# Patient Record
Sex: Male | Born: 1995 | Race: White | Hispanic: No | Smoking: Former smoker
Health system: Southern US, Community
[De-identification: ages and names within clinical notes are randomized; demographics above are authoritative.]

## PROBLEM LIST (undated history)

## (undated) DIAGNOSIS — Z87898 Personal history of other specified conditions: Secondary | ICD-10-CM

## (undated) HISTORY — DX: Personal history of other specified conditions: Z87.898

## (undated) HISTORY — PX: SINUS IRRIGATION: SHX2411

---
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
## 2014-09-09 ENCOUNTER — Encounter (HOSPITAL_COMMUNITY): Payer: Self-pay | Admitting: Emergency Medicine

## 2014-09-09 ENCOUNTER — Emergency Department (INDEPENDENT_AMBULATORY_CARE_PROVIDER_SITE_OTHER)
Admission: EM | Admit: 2014-09-09 | Discharge: 2014-09-09 | Disposition: A | Discharge status: Home / Self Care | Payer: BC Managed Care – PPO | Source: Home / Self Care | Attending: Emergency Medicine | Admitting: Emergency Medicine

## 2014-09-09 DIAGNOSIS — G43009 Migraine without aura, not intractable, without status migrainosus: Secondary | ICD-10-CM

## 2014-09-09 MED ORDER — SUMATRIPTAN SUCCINATE 50 MG PO TABS: 50.0000 mg | ORAL_TABLET | Freq: Once | ORAL | Status: DC | PRN

## 2014-09-09 MED ORDER — KETOROLAC TROMETHAMINE 60 MG/2ML IM SOLN
INTRAMUSCULAR | Status: AC
  Filled 2014-09-09: qty 2

## 2014-09-09 MED ORDER — METOCLOPRAMIDE HCL 5 MG/ML IJ SOLN
10.0000 mg | Freq: Once | INTRAMUSCULAR | Status: AC
  Administered 2014-09-09: 10 mg via INTRAMUSCULAR

## 2014-09-09 MED ORDER — DIPHENHYDRAMINE HCL 50 MG/ML IJ SOLN
25.0000 mg | Freq: Once | INTRAMUSCULAR | Status: AC
  Administered 2014-09-09: 25 mg via INTRAMUSCULAR

## 2014-09-09 MED ORDER — METOCLOPRAMIDE HCL 5 MG/ML IJ SOLN
INTRAMUSCULAR | Status: AC
  Filled 2014-09-09: qty 2

## 2014-09-09 MED ORDER — DIPHENHYDRAMINE HCL 50 MG/ML IJ SOLN
INTRAMUSCULAR | Status: AC
  Filled 2014-09-09: qty 1

## 2014-09-09 MED ORDER — KETOROLAC TROMETHAMINE 60 MG/2ML IM SOLN
60.0000 mg | Freq: Once | INTRAMUSCULAR | Status: AC
  Administered 2014-09-09: 60 mg via INTRAMUSCULAR

## 2014-09-09 NOTE — ED Notes (Signed)
Pt states that he has had headache feeling like a migraine for weeks with sinus pain. Pt  States that he has nausea and vomiting. Pt is currently in room vomiting. Pt is in no acute distress.

## 2014-09-09 NOTE — Discharge Instructions (Signed)
Please follow up with a regular doctor as soon as possible. Use the imitrex as soon as you feel a headache starting, don't wait until it's really bad or the medicine won't work.    Migraine Headache A migraine headache is an intense, throbbing pain on one or both sides of your head. A migraine can last for 30 minutes to several hours. CAUSES  The exact cause of a migraine headache is not always known. However, a migraine may be caused when nerves in the brain become irritated and release chemicals that cause inflammation. This causes pain. Certain things may also trigger migraines, such as:  Alcohol.  Smoking.  Stress.  Menstruation.  Aged cheeses.  Foods or drinks that contain nitrates, glutamate, aspartame, or tyramine.  Lack of sleep.  Chocolate.  Caffeine.  Hunger.  Physical exertion.  Fatigue.  Medicines used to treat chest pain (nitroglycerine), birth control pills, estrogen, and some blood pressure medicines. SIGNS AND SYMPTOMS  Pain on one or both sides of your head.  Pulsating or throbbing pain.  Severe pain that prevents daily activities.  Pain that is aggravated by any physical activity.  Nausea, vomiting, or both.  Dizziness.  Pain with exposure to bright lights, loud noises, or activity.  General sensitivity to bright lights, loud noises, or smells. Before you get a migraine, you may get warning signs that a migraine is coming (aura). An aura may include:  Seeing flashing lights.  Seeing bright spots, halos, or zigzag lines.  Having tunnel vision or blurred vision.  Having feelings of numbness or tingling.  Having trouble talking.  Having muscle weakness. DIAGNOSIS  A migraine headache is often diagnosed based on:  Symptoms.  Physical exam.  A CT scan or MRI of your head. These imaging tests cannot diagnose migraines, but they can help rule out other causes of headaches. TREATMENT Medicines may be given for pain and nausea.  Medicines can also be given to help prevent recurrent migraines.  HOME CARE INSTRUCTIONS  Only take over-the-counter or prescription medicines for pain or discomfort as directed by your health care provider. The use of long-term narcotics is not recommended.  Lie down in a dark, quiet room when you have a migraine.  Keep a journal to find out what may trigger your migraine headaches. For example, write down:  What you eat and drink.  How much sleep you get.  Any change to your diet or medicines.  Limit alcohol consumption.  Quit smoking if you smoke.  Get 7-9 hours of sleep, or as recommended by your health care provider.  Limit stress.  Keep lights dim if bright lights bother you and make your migraines worse. SEEK IMMEDIATE MEDICAL CARE IF:   Your migraine becomes severe.  You have a fever.  You have a stiff neck.  You have vision loss.  You have muscular weakness or loss of muscle control.  You start losing your balance or have trouble walking.  You feel faint or pass out.  You have severe symptoms that are different from your first symptoms. MAKE SURE YOU:   Understand these instructions.  Will watch your condition.  Will get help right away if you are not doing well or get worse. Document Released: 10/21/2005 Document Revised: 03/07/2014 Document Reviewed: 06/28/2013 [facility] Patient Information 2015 [place], [place]. This information is not intended to replace advice given to you by your health care provider. Make sure you discuss any questions you have with your health care provider.

## 2014-09-09 NOTE — ED Provider Notes (Signed)
CSN: [id number]     Arrival date & time 09/09/14  1843 History   First MD Initiated Contact with Patient 09/09/14 1936     Chief Complaint  Patient presents with  . Migraine  . Nausea  . Emesis   (Consider location/radiation/quality/duration/timing/severity/associated sxs/prior Treatment) HPI Comments: Pt reports frequent headaches for last month and weekly episodes of severe headache lasting all day associated with vomiting. Pt can go to sleep with severe headaches and wake up feeling better. Has not tried to treat symptoms. Describes headache location as behind both eyes, back of head and into neck.   Patient is a 18 y.o. male presenting with migraines and vomiting. The history is provided by the patient. No language interpreter was used.  Migraine This is a recurrent problem. The current episode started 6 to 12 hours ago. The problem occurs constantly. The problem has been gradually worsening. Associated symptoms include headaches. Pertinent negatives include no abdominal pain. Exacerbated by: lights. Nothing (nothing tried) relieves the symptoms. He has tried nothing for the symptoms.  Emesis Associated symptoms: headaches   Associated symptoms: no abdominal pain, no chills and no diarrhea     History reviewed. No pertinent past medical history. Past Surgical History  Procedure Laterality Date  . Sinus irrigation     History reviewed. No pertinent family history. History  Substance Use Topics  . Smoking status: Never Smoker   . Smokeless tobacco: Not on file  . Alcohol Use: No    Review of Systems  Constitutional: Negative for fever and chills.  Eyes: Positive for photophobia. Negative for visual disturbance.  Gastrointestinal: Positive for nausea and vomiting. Negative for abdominal pain, diarrhea and constipation.  Neurological: Positive for headaches.    Allergies  Review of patient's allergies indicates not on file.  Home Medications   Prior to Admission  medications   Medication Sig Start Date End Date Taking? Authorizing Provider  SUMAtriptan (IMITREX) 50 MG tablet Take 1 tablet (50 mg total) by mouth once as needed for migraine. May repeat in 2 hours if headache persists or recurs. 09/09/14 09/09/15  Cathlyn ParsonsAngela M Taviana Westergren, NP   BP 143/79 mmHg  Pulse 79  Temp(Src) 97.4 F (36.3 C) (Oral)  Resp 14  SpO2 100% Physical Exam  Constitutional: He is oriented to person, place, and time. He appears well-developed and well-nourished. He appears ill.  Eyes: Conjunctivae, EOM and lids are normal. Pupils are equal, round, and reactive to light.  Cardiovascular: Normal rate and regular rhythm.   Pulmonary/Chest: Effort normal and breath sounds normal.  Abdominal: Soft. Normal appearance and bowel sounds are normal. He exhibits no distension. There is no tenderness. There is no rigidity, no rebound and no guarding.  Neurological: He is alert and oriented to person, place, and time. Gait normal.    ED Course  Procedures (including critical care time) Labs Review Labs Reviewed - No data to display  Imaging Review No results found.   MDM   1. Migraine without aura and without status migrainosus, not intractable   Most likely migraine headaches. Pt given IM benadryl 25mg , IM toradol 60mg , and IM reglan 10mg  [facility] Rx imitrex 50mg  po prn migraine; may repeat in 2 hours if headache persists or recurs.   Pt to f/u with a pcp for further management of headaches.     Cathlyn ParsonsAngela M Railey Glad, NP 09/09/14 2109  Cathlyn ParsonsAngela M Chief Walkup, NP 09/09/14 2110

## 2014-10-05 ENCOUNTER — Ambulatory Visit: Payer: Self-pay | Admitting: Unknown Physician Specialty

## 2014-11-15 ENCOUNTER — Encounter (INDEPENDENT_AMBULATORY_CARE_PROVIDER_SITE_OTHER): Payer: Self-pay

## 2014-11-15 ENCOUNTER — Encounter: Payer: Self-pay | Admitting: Family Medicine

## 2014-11-15 ENCOUNTER — Ambulatory Visit (INDEPENDENT_AMBULATORY_CARE_PROVIDER_SITE_OTHER): Payer: 59 | Admitting: Family Medicine

## 2014-11-15 VITALS — BP 112/70 | HR 80 | Temp 98.4°F | Ht 67.5 in | Wt 190.8 lb

## 2014-11-15 DIAGNOSIS — Z0000 Encounter for general adult medical examination without abnormal findings: Secondary | ICD-10-CM

## 2014-11-15 DIAGNOSIS — G43009 Migraine without aura, not intractable, without status migrainosus: Secondary | ICD-10-CM

## 2014-11-15 DIAGNOSIS — J329 Chronic sinusitis, unspecified: Secondary | ICD-10-CM | POA: Insufficient documentation

## 2014-11-15 DIAGNOSIS — J069 Acute upper respiratory infection, unspecified: Secondary | ICD-10-CM

## 2014-11-15 DIAGNOSIS — G43909 Migraine, unspecified, not intractable, without status migrainosus: Secondary | ICD-10-CM | POA: Insufficient documentation

## 2014-11-15 MED ORDER — SUMATRIPTAN SUCCINATE 50 MG PO TABS: 50.0000 mg | ORAL_TABLET | Freq: Once | ORAL | Status: DC | PRN

## 2014-11-15 NOTE — Progress Notes (Signed)
Pre visit review using our clinic review tool, if applicable. No additional management support is needed unless otherwise documented below in the visit note. 

## 2014-11-15 NOTE — Patient Instructions (Signed)
I sent imitrex to your pharmacy  See the neurologist as planned  Labs today for wellness  Schedule a physical in the spring   For cold symptoms - try mucinex dm (helps cough and congestion in head and chest)  Lots of fluids and rest   Update if not starting to improve in a week or if worsening

## 2014-11-15 NOTE — Progress Notes (Signed)
Subjective:    Patient ID: Max Duffy, male    DOB: May 24, 1996, 19 y.o.   MRN: [id number]  HPI Here to get established for primary care   Does not have regular ENT - Dr Jenne CampusMcQueen  [facility]   [facility] has [facility]t end [facility] the mo  Had MRI - chronic sinusitis  Has had sinus surgery in the past (almost lost his eye to a severe sinus infection at age 89 )   Hard to tell the difference between sinus problems and migraine  Thinks he has a family hx of migraines  Had vomiting with headaches  For a while - almost every day  Now uses imitrex - about once per week and it works well   Is on clindamycin for chronic sinus infection  This is helping some  Will see Dr Jenne CampusMcQueen for f/u  Does not think he has allergies / but he may   Thinks he is coming down with a cold Had a fever last week-- aches and chills/ (? 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 From stress   There are no active problems to display for this patient.  Past Medical History  Diagnosis Date  . History of headache    Past Surgical History  Procedure Laterality Date  . Sinus irrigation      age 19  . Knee surgery  2010   History  Substance Use Topics  . Smoking status: Never Smoker   . Smokeless tobacco: Not on file  . Alcohol Use: No   Family History  Problem Relation Age of Onset  . Hypertension Maternal Grandfather   . Diabetes Paternal Grandfather   . Hypertension Maternal Uncle   . Diabetes Father    No Known Allergies Current Outpatient Prescriptions on File Prior to Visit  Medication Sig Dispense Refill  . SUMAtriptan (IMITREX) 50 MG tablet Take 1 tablet (50  mg total) by mouth once as needed for migraine. May repeat in 2 hours if headache persists or recurs. 30 tablet 2   No current facility-administered medications on file prior to visit.       Review of Systems Review of Systems  Constitutional: Negative for fever, appetite change,  and unexpected weight change.  Pos for congestion and rhinorrhea , neg for sinus pain today Eyes: Negative for pain and visual disturbance.  Respiratory: Negative for wheeze  and shortness of breath.   Cardiovascular: Negative for cp or palpitations    Gastrointestinal: Negative for nausea, diarrhea and constipation.  Genitourinary: Negative for urgency and frequency.  Skin: Negative for pallor or rash   Neurological: Negative for weakness, light-headedness, numbness and headaches.  Hematological: Negative for adenopathy. Does not bruise/bleed easily.  Psychiatric/Behavioral: Negative for dysphoric mood. The patient is not nervous/anxious.         Objective:   Physical Exam  Constitutional: He appears well-developed and well-nourished. No distress.  HENT:  Head: Normocephalic and atraumatic.  Right Ear: External ear normal.  Left Ear: External ear  normal.  Mouth/Throat: Oropharynx is clear and moist.  Boggy nares No sinus tenderness Throat is clear   Eyes: Conjunctivae and EOM are normal. Pupils are equal, round, and reactive to light. Right eye exhibits no discharge. Left eye exhibits no discharge. No scleral icterus.  Neck: Normal range of motion. Neck supple. No JVD present. Carotid bruit is not present. No thyromegaly present.  Cardiovascular: Normal rate, regular rhythm, normal heart sounds and intact distal pulses.  Exam reveals no gallop.   Pulmonary/Chest: Effort normal and breath sounds normal. No respiratory distress. He has no wheezes. He exhibits no tenderness.  Abdominal: He exhibits no abdominal bruit.  Musculoskeletal: He exhibits no edema or tenderness.  Lymphadenopathy:    He has no  cervical adenopathy.  Neurological: He is alert. He has normal reflexes.  Skin: Skin is warm and dry. No rash noted. No erythema. No pallor.  Psychiatric: He has a normal mood and affect.          Assessment & Plan:   Problem List Items Addressed This Visit      Cardiovascular and Mediastinum   Migraine    Sometimes confused with sinus headache Disc lifestyle change for migraine F/u with neuro as planned       Relevant Medications   SUMAtriptan (IMITREX) tablet     Respiratory   Sinusitis, chronic    Continue f/u with ENT  On cleocin       Relevant Medications   clindamycin (CLEOCIN) 300 MG capsule   Viral URI - Primary    Re assuring exam Already on cleocin for chronic sinusitis  Disc symptomatic care - see instructions on AVS  Update if not starting to improve in a week or if worsening        Relevant Medications   clindamycin (CLEOCIN) 300 MG capsule     Other   Routine general medical examination at a health care facility    Will do lab today for this exam in the future       Relevant Orders   CBC with Differential (Completed)   Comprehensive metabolic panel (Completed)   TSH (Completed)   Lipid panel (Completed)

## 2014-11-17 LAB — COMPREHENSIVE METABOLIC PANEL
A/G RATIO: 1.8 (ref 1.1–2.5)
ALBUMIN: 4.9 g/dL (ref 3.5–5.5)
ALT: 87 IU/L — ABNORMAL HIGH (ref 0–44)
AST: 28 IU/L (ref 0–40)
Alkaline Phosphatase: 69 IU/L (ref 39–117)
BUN / CREAT RATIO: 10 (ref 8–19)
BUN: 10 mg/dL (ref 6–20)
CHLORIDE: 99 mmol/L (ref 97–108)
CO2: 25 mmol/L (ref 18–29)
CREATININE: 0.96 mg/dL (ref 0.76–1.27)
Calcium: 10 mg/dL (ref 8.7–10.2)
GFR calc Af Amer: 131 mL/min/{1.73_m2} (ref >59)
GFR, EST NON AFRICAN AMERICAN: 113 mL/min/{1.73_m2} (ref >59)
GLUCOSE: 94 mg/dL (ref 65–99)
Globulin, Total: 2.8 g/dL (ref 1.5–4.5)
Potassium: 4.4 mmol/L (ref 3.5–5.2)
SODIUM: 141 mmol/L (ref 134–144)
Total Bilirubin: 0.3 mg/dL (ref 0.0–1.2)
Total Protein: 7.7 g/dL (ref 6.0–8.5)

## 2014-11-17 LAB — CBC WITH DIFFERENTIAL/PLATELET
Basophils Absolute: 0 10*3/uL (ref 0.0–0.2)
Basos: 0 %
Eos: 3 %
Eosinophils Absolute: 0.3 10*3/uL (ref 0.0–0.4)
HCT: 41.7 % (ref 37.5–51.0)
Hemoglobin: 14.4 g/dL (ref 12.6–17.7)
Immature Grans (Abs): 0 10*3/uL (ref 0.0–0.1)
Immature Granulocytes: 0 %
Lymphocytes Absolute: 2.8 10*3/uL (ref 0.7–3.1)
Lymphs: 26 %
MCH: 28.8 pg (ref 26.6–33.0)
MCHC: 34.5 g/dL (ref 31.5–35.7)
MCV: 83 fL (ref 79–97)
Monocytes Absolute: 0.7 10*3/uL (ref 0.1–0.9)
Monocytes: 7 %
Neutrophils Absolute: 6.7 10*3/uL (ref 1.4–7.0)
Neutrophils Relative %: 64 %
RBC: 5 x10E6/uL (ref 4.14–5.80)
RDW: 13.1 % (ref 12.3–15.4)
WBC: 10.6 10*3/uL (ref 3.4–10.8)

## 2014-11-17 LAB — LIPID PANEL
Chol/HDL Ratio: 4 ratio (ref 0.0–5.0)
Cholesterol, Total: 127 mg/dL (ref 100–199)
HDL: 32 mg/dL — ABNORMAL LOW
LDL Calculated: 73 mg/dL (ref 0–99)
Triglycerides: 112 mg/dL (ref 0–149)
VLDL Cholesterol Cal: 22 mg/dL (ref 5–40)

## 2014-11-17 LAB — TSH: TSH: 1.34 u[IU]/mL (ref 0.450–4.500)

## 2014-11-17 NOTE — Assessment & Plan Note (Signed)
Re assuring exam Already on cleocin for chronic sinusitis  Disc symptomatic care - see instructions on AVS  Update if not starting to improve in a week or if worsening

## 2014-11-17 NOTE — Assessment & Plan Note (Signed)
Continue f/u with ENT  On cleocin

## 2014-11-17 NOTE — Assessment & Plan Note (Signed)
Will do lab today for this exam in the future

## 2014-11-17 NOTE — Assessment & Plan Note (Signed)
Sometimes confused with sinus headache Disc lifestyle change for migraine F/u with neuro as planned

## 2014-11-24 ENCOUNTER — Encounter: Payer: Self-pay | Admitting: *Deleted

## 2014-12-18 ENCOUNTER — Telehealth: Payer: Self-pay | Admitting: Family Medicine

## 2014-12-18 DIAGNOSIS — R740 Nonspecific elevation of levels of transaminase and lactic acid dehydrogenase [LDH]: Principal | ICD-10-CM

## 2014-12-18 DIAGNOSIS — R7401 Elevation of levels of liver transaminase levels: Secondary | ICD-10-CM

## 2014-12-18 NOTE — Telephone Encounter (Signed)
-----   Message from Alvina Chouerri J Walsh sent at 12/15/2014  3:08 PM EST ----- Regarding: Lab orders for Monday, 2.15.16 Labs for a f/u

## 2014-12-19 ENCOUNTER — Telehealth: Payer: Self-pay | Admitting: Family Medicine

## 2014-12-19 ENCOUNTER — Other Ambulatory Visit: Payer: 59

## 2014-12-19 DIAGNOSIS — R748 Abnormal levels of other serum enzymes: Secondary | ICD-10-CM

## 2014-12-19 NOTE — Telephone Encounter (Signed)
Pt works at [employer]  Please put lab work in the [employer] system.  He is wanting to go to the ([address]) [address] location for blood work.

## 2014-12-20 NOTE — Telephone Encounter (Signed)
done

## 2014-12-20 NOTE — Telephone Encounter (Signed)
Terri-could you please re order his future labs for [employer]- release them and mail him a copy to take to his lab appt ? Thanks

## 2015-03-01 ENCOUNTER — Other Ambulatory Visit: Payer: Self-pay | Admitting: Family Medicine

## 2015-03-02 NOTE — Telephone Encounter (Signed)
done

## 2015-03-02 NOTE — Telephone Encounter (Signed)
Please refill times 3 

## 2015-03-02 NOTE — Telephone Encounter (Signed)
Electronic refill request, last refilled on 11/15/14 #10 with 1 additional refill, please advise

## 2015-04-21 ENCOUNTER — Ambulatory Visit (INDEPENDENT_AMBULATORY_CARE_PROVIDER_SITE_OTHER): Payer: 59 | Admitting: Family Medicine

## 2015-04-21 ENCOUNTER — Encounter: Payer: Self-pay | Admitting: Family Medicine

## 2015-04-21 VITALS — BP 138/70 | HR 94 | Temp 97.9°F | Ht 67.75 in | Wt 210.8 lb

## 2015-04-21 DIAGNOSIS — R7401 Elevation of levels of liver transaminase levels: Secondary | ICD-10-CM

## 2015-04-21 DIAGNOSIS — E669 Obesity, unspecified: Secondary | ICD-10-CM | POA: Insufficient documentation

## 2015-04-21 DIAGNOSIS — K5900 Constipation, unspecified: Secondary | ICD-10-CM | POA: Insufficient documentation

## 2015-04-21 DIAGNOSIS — Z114 Encounter for screening for human immunodeficiency virus [HIV]: Secondary | ICD-10-CM | POA: Insufficient documentation

## 2015-04-21 DIAGNOSIS — G43009 Migraine without aura, not intractable, without status migrainosus: Secondary | ICD-10-CM | POA: Diagnosis not present

## 2015-04-21 DIAGNOSIS — Z0000 Encounter for general adult medical examination without abnormal findings: Secondary | ICD-10-CM

## 2015-04-21 DIAGNOSIS — R740 Nonspecific elevation of levels of transaminase and lactic acid dehydrogenase [LDH]: Secondary | ICD-10-CM

## 2015-04-21 MED ORDER — GABAPENTIN 100 MG PO CAPS: 300.0000 mg | ORAL_CAPSULE | Freq: Every day | ORAL | Status: DC

## 2015-04-21 NOTE — Patient Instructions (Addendum)
Lab today  Start working on weight loss (check out the APP "myfitnesspal" on your phone) Weight loss will help liver/migraines and also help sleep/snoring problem  Eat a low fat diet  Exercise to raise the "HDL"- good cholesterol  Avoid alcohol and tylenol (for liver health also) Get miralax over the counter to help constipation- take 1-3 servings per day (mixed with water as directed) until you have regular bowel movements and then you can decide how often you need it  Drink lots of water and avoid caffeine  For migraine and sleep -try gabapentin 100 mg (this may make you sleepy) Start with 1 pill at bedtime for a week Then go up to 2 pills at bedtime for a week  Then 3 pills at bedtime (if well tolerated)   Follow up with me in about a month

## 2015-04-21 NOTE — Progress Notes (Signed)
Subjective:    Patient ID: Max Duffy, male    DOB: April 16, 1996, 19 y.o.   MRN: [id number]  HPI Here for health maintenance exam and to review chronic medical problems    Wt is up 10 lb with bmi of 32 - and he needs to get weight off   Did not get a flu shot in the fall  Td 10/10 HIV -interested in screening   Feels ok overall   Migraines have been more frequent lately  Thinks due to stress  Buying a house - hoping to close at the end of the month  (still living with grandparents)  Work is going ok   Has a 59 month old who is teething  Financial stress   He was supposed to see a headache doctor (neuro) at [facility]- and never went   Had to quit his 2nd job  Wife quit her part time job-still looking   Doing a program with [facility] (his co)- for counseling for sleep   Has a wisdom tooth that is not coming in right  Not sleeping well at night - mind races -not getting enough sleep  Is trying to avoid caffeine - except when he has a headache Has not been back to his headache doctor   Trying to drink more water   Not a lot of exercise - too busy  Wt is up 20 lb with bmi of 32    Taking imitrex for most of his ha  No otc meds (tylenol and advil do not help)   Also some blood in stool occ- mixed in with stool  Mother has polyps /colon  ? Hemorrhoids-does a lot of lifting  Also tends to be constipated     Results for orders placed or performed in visit on 11/15/14  CBC with Differential  Result Value Ref Range   WBC 10.6 3.4 - 10.8 x10E3/uL   RBC 5.00 4.14 - 5.80 x10E6/uL   Hemoglobin 14.4 12.6 - 17.7 g/dL   HCT 56.3 14.9 - 70.2 %   MCV 83 79 - 97 fL   MCH 28.8 26.6 - 33.0 pg   MCHC 34.5 31.5 - 35.7 g/dL   RDW 63.7 85.8 - 85.0 %   Neutrophils Relative % 64 %   Lymphs 26 %   Monocytes 7 %   Eos 3 %   Basos 0 %   Neutrophils Absolute 6.7 1.4 - 7.0 x10E3/uL   Lymphocytes Absolute 2.8 0.7 - 3.1 x10E3/uL   Monocytes Absolute 0.7 0.1 - 0.9 x10E3/uL   Eosinophils Absolute 0.3 0.0 - 0.4 x10E3/uL   Basophils Absolute 0.0 0.0 - 0.2 x10E3/uL   Immature Granulocytes 0 %   Immature Grans (Abs) 0.0 0.0 - 0.1 x10E3/uL   Hematology Comments: Note:   Comprehensive metabolic panel  Result Value Ref Range   Glucose 94 65 - 99 mg/dL   BUN 10 6 - 20 mg/dL   Creatinine, Ser 2.77 0.76 - 1.27 mg/dL   GFR calc non Af Amer 113 >59 mL/min/1.73   GFR calc Af Amer 131 >59 mL/min/1.73   BUN/Creatinine Ratio 10 8 - 19   Sodium 141 134 - 144 mmol/L   Potassium 4.4 3.5 - 5.2 mmol/L   Chloride 99 97 - 108 mmol/L   CO2 25 18 - 29 mmol/L   Calcium 10.0 8.7 - 10.2 mg/dL   Total Protein 7.7 6.0 - 8.5 g/dL   Albumin 4.9 3.5 - 5.5 g/dL   Globulin, Total 2.8  1.5 - 4.5 g/dL   Albumin/Globulin Ratio 1.8 1.1 - 2.5   Total Bilirubin 0.3 0.0 - 1.2 mg/dL   Alkaline Phosphatase 69 39 - 117 IU/L   AST 28 0 - 40 IU/L   ALT 87 (H) 0 - 44 IU/L  TSH  Result Value Ref Range   TSH 1.340 0.450 - 4.500 uIU/mL  Lipid panel  Result Value Ref Range   Cholesterol, Total 127 100 - 199 mg/dL   Triglycerides 161 0 - 149 mg/dL   HDL 32 (L) >09 mg/dL   VLDL Cholesterol Cal 22 5 - 40 mg/dL   LDL Calculated 73 0 - 99 mg/dL   Chol/HDL Ratio 4.0 0.0 - 5.0 ratio units    One liver number was up  Does not drink etoh or take otc med No exp to hepatitis  Will re check that today   Review of Systems Review of Systems  Constitutional: Negative for fever, appetite change, fatigue and unexpected weight change.  Eyes: Negative for pain and visual disturbance.  Respiratory: Negative for cough and shortness of breath.   Cardiovascular: Negative for cp or palpitations    Gastrointestinal: Negative for nausea, diarrhea and constipation. pos for pain under ribs sharp/fleeting at times also noticed some blood in stool  Genitourinary: Negative for urgency and frequency.  Skin: Negative for pallor or rash   Neurological: Negative for weakness, light-headedness, numbness and headaches.    Hematological: Negative for adenopathy. Does not bruise/bleed easily.  Psychiatric/Behavioral: Negative for dysphoric mood. The patient is not nervous/anxious.         Objective:   Physical Exam  Constitutional: He is oriented to person, place, and time. He appears well-developed and well-nourished. No distress.  obese and well appearing   HENT:  Head: Normocephalic and atraumatic.  Right Ear: External ear normal.  Left Ear: External ear normal.  Nose: Nose normal.  Mouth/Throat: Oropharynx is clear and moist. No oropharyngeal exudate.  No sinus tenderness No temporal tenderness  No TMJ tenderness  Eyes: Conjunctivae and EOM are normal. Pupils are equal, round, and reactive to light. Right eye exhibits no discharge. Left eye exhibits no discharge. No scleral icterus.  No nystagmus  Neck: Normal range of motion and full passive range of motion without pain. Neck supple. No JVD present. Carotid bruit is not present. No tracheal deviation present. No thyromegaly present.  Cardiovascular: Normal rate, regular rhythm, normal heart sounds and intact distal pulses.  Exam reveals no gallop.   No murmur heard. Pulmonary/Chest: Effort normal and breath sounds normal. No respiratory distress. He has no wheezes. He has no rales. He exhibits no tenderness.  Abdominal: Soft. Bowel sounds are normal. He exhibits no distension, no abdominal bruit and no mass. There is no tenderness.  Musculoskeletal: He exhibits no edema or tenderness.  Lymphadenopathy:    He has no cervical adenopathy.  Neurological: He is alert and oriented to person, place, and time. He has normal strength and normal reflexes. He displays no atrophy and no tremor. No cranial nerve deficit or sensory deficit. He exhibits normal muscle tone. He displays a negative Romberg sign. Coordination and gait normal.  No focal cerebellar signs   Skin: Skin is warm and dry. No rash noted. No erythema. No pallor.  Psychiatric: He has a normal  mood and affect. His behavior is normal. Thought content normal.  Pleasant and talkative Discusses stressors           Assessment & Plan:   Problem List Items  Addressed This Visit    Constipation    Rev habits- fluid/fiber intake and poss of hemorrhoids  Recommend miralax regimen to begin and update if no improvement  F/u appt made       Elevated transaminase level - Primary    Re check today  Suspect fatty liver  No etoh or acetaminophen  No symptoms  Continue to follow  Enc wt loss       Relevant Orders   Hepatic function panel   Hepatitis panel, acute (Completed)   Migraine    Now daily Disc lifestyle change for HA- handout given and see AVS Will inc water intake  Start gabapentin at night - can titrate , for sleep and also ha prev (can be inc to tid) Discussed expectations of this medication including time to effectiveness and mechanism of action, also poss of side effects (early and late)- including mental fuzziness, weight or appetite change, nausea and poss of worse dep or anxiety (even suicidal thoughts)  Pt voiced understanding and will stop med and update if this occurs   Reassuring exam F/u planned       Relevant Medications   gabapentin (NEURONTIN) 100 MG capsule   Obesity    Discussed how this problem influences overall health and the risks it imposes  Reviewed plan for weight loss with lower calorie diet (via better food choices and also portion control or program like weight watchers) and exercise building up to or more than 30 minutes 5 days per week including some aerobic activity         Routine general medical examination at a health care facility    Reviewed health habits including diet and exercise and skin cancer prevention Reviewed appropriate screening tests for age  Also reviewed health mt list, fam hx and immunization status , as well as social and family history    See HPI  Labs reviewed  Enc wt loss       Screening for HIV (human  immunodeficiency virus)    HIV test today      Relevant Orders   HIV antibody (with reflex) (Completed)

## 2015-04-21 NOTE — Progress Notes (Signed)
Pre visit review using our clinic review tool, if applicable. No additional management support is needed unless otherwise documented below in the visit note. 

## 2015-04-22 LAB — HEPATITIS PANEL, ACUTE
HEP B C IGM: NEGATIVE
HEP B S AG: NEGATIVE
Hep A IgM: NEGATIVE
Hep C Virus Ab: 0.1 s/co ratio (ref 0.0–0.9)

## 2015-04-22 LAB — HIV ANTIBODY (ROUTINE TESTING W REFLEX): HIV SCREEN 4TH GENERATION: NONREACTIVE

## 2015-04-22 NOTE — Assessment & Plan Note (Signed)
Now daily Disc lifestyle change for HA- handout given and see AVS Will inc water intake  Start gabapentin at night - can titrate , for sleep and also ha prev (can be inc to tid) Discussed expectations of this medication including time to effectiveness and mechanism of action, also poss of side effects (early and late)- including mental fuzziness, weight or appetite change, nausea and poss of worse dep or anxiety (even suicidal thoughts)  Pt voiced understanding and will stop med and update if this occurs   Reassuring exam F/u planned

## 2015-04-22 NOTE — Assessment & Plan Note (Signed)
Discussed how this problem influences overall health and the risks it imposes  Reviewed plan for weight loss with lower calorie diet (via better food choices and also portion control or program like weight watchers) and exercise building up to or more than 30 minutes 5 days per week including some aerobic activity    

## 2015-04-22 NOTE — Assessment & Plan Note (Signed)
HIV test today

## 2015-04-22 NOTE — Assessment & Plan Note (Signed)
Re check today  Suspect fatty liver  No etoh or acetaminophen  No symptoms  Continue to follow  Enc wt loss

## 2015-04-22 NOTE — Assessment & Plan Note (Signed)
Rev habits- fluid/fiber intake and poss of hemorrhoids  Recommend miralax regimen to begin and update if no improvement  F/u appt made

## 2015-04-22 NOTE — Assessment & Plan Note (Signed)
Reviewed health habits including diet and exercise and skin cancer prevention Reviewed appropriate screening tests for age  Also reviewed health mt list, fam hx and immunization status , as well as social and family history    See HPI  Labs reviewed  Enc wt loss

## 2015-04-25 NOTE — Addendum Note (Signed)
Addended by: Alvina Chou on: 04/25/2015 02:55 PM   Modules accepted: Orders

## 2015-04-26 LAB — HEPATIC FUNCTION PANEL
ALBUMIN: 4.8 g/dL (ref 3.5–5.5)
ALT: 31 IU/L (ref 0–44)
AST: 22 IU/L (ref 0–40)
Alkaline Phosphatase: 57 IU/L (ref 39–117)
Bilirubin Total: 0.3 mg/dL (ref 0.0–1.2)
Bilirubin, Direct: 0.07 mg/dL (ref 0.00–0.40)
TOTAL PROTEIN: 7.1 g/dL (ref 6.0–8.5)

## 2015-04-26 LAB — SPECIMEN STATUS REPORT

## 2015-05-31 ENCOUNTER — Ambulatory Visit: Payer: 59 | Admitting: Family Medicine

## 2015-06-07 ENCOUNTER — Ambulatory Visit (INDEPENDENT_AMBULATORY_CARE_PROVIDER_SITE_OTHER): Payer: 59 | Admitting: Family Medicine

## 2015-06-07 ENCOUNTER — Encounter: Payer: Self-pay | Admitting: Family Medicine

## 2015-06-07 VITALS — BP 132/86 | HR 85 | Temp 98.0°F | Wt 221.5 lb

## 2015-06-07 DIAGNOSIS — F430 Acute stress reaction: Secondary | ICD-10-CM | POA: Insufficient documentation

## 2015-06-07 DIAGNOSIS — R400 Somnolence: Secondary | ICD-10-CM

## 2015-06-07 DIAGNOSIS — G43009 Migraine without aura, not intractable, without status migrainosus: Secondary | ICD-10-CM | POA: Diagnosis not present

## 2015-06-07 DIAGNOSIS — R0683 Snoring: Secondary | ICD-10-CM | POA: Diagnosis not present

## 2015-06-07 DIAGNOSIS — J309 Allergic rhinitis, unspecified: Secondary | ICD-10-CM | POA: Insufficient documentation

## 2015-06-07 DIAGNOSIS — J302 Other seasonal allergic rhinitis: Secondary | ICD-10-CM | POA: Diagnosis not present

## 2015-06-07 DIAGNOSIS — G4710 Hypersomnia, unspecified: Secondary | ICD-10-CM | POA: Diagnosis not present

## 2015-06-07 DIAGNOSIS — E669 Obesity, unspecified: Secondary | ICD-10-CM

## 2015-06-07 NOTE — Progress Notes (Signed)
Pre visit review using our clinic review tool, if applicable. No additional management support is needed unless otherwise documented below in the visit note. 

## 2015-06-07 NOTE — Progress Notes (Signed)
Subjective:    Patient ID: Max Duffy, male    DOB: 10-17-1996, 19 y.o.   MRN: [id number]  HPI Here for f/u of chronic health issues including headaches and sleep problems   Started gabapentin 300 mg at bedtime  Still getting daily headaches   Sleeps a little better (sleeps through the night) - but often wakes up still tired - getting 6-7 hours per night (tries to get 8-9)   He does snore - loudly - a lot of family has it  Wonders about sleep apnea   occ wakes up with headaches   Lab Results  Component Value Date   ALT 31 04/21/2015   AST 22 04/21/2015   ALKPHOS 57 04/21/2015   BILITOT 0.3 04/21/2015     Is trying miralax for constipation - ran out and never got it again  Kept forgetting  It did help a bit    Also R ear pops a lot  Has allergies   Patient Active Problem List   Diagnosis Date Noted  . Constipation 04/21/2015  . Screening for HIV (human immunodeficiency virus) 04/21/2015  . Obesity 04/21/2015  . Elevated transaminase level 12/18/2014  . Viral URI 11/15/2014  . Sinusitis, chronic 11/15/2014  . Migraine 11/15/2014  . Routine general medical examination at a health care facility 11/15/2014   Past Medical History  Diagnosis Date  . History of headache    Past Surgical History  Procedure Laterality Date  . Sinus irrigation      age 5  . Knee surgery  2010   History  Substance Use Topics  . Smoking status: Never Smoker   . Smokeless tobacco: Not on file  . Alcohol Use: No   Family History  Problem Relation Age of Onset  . Hypertension Maternal Grandfather   . Diabetes Paternal Grandfather   . Hypertension Maternal Uncle   . Diabetes Father    No Known Allergies Current Outpatient Prescriptions on File Prior to Visit  Medication Sig Dispense Refill  . gabapentin (NEURONTIN) 100 MG capsule Take 3 capsules (300 mg total) by mouth at bedtime. 90 capsule 3  . SUMAtriptan (IMITREX) 50 MG tablet TAKE 1 TABLET BY MOUTH ONCE AS NEEDED  FOR MIGRAINE. MAY REPEAT IN 2 HOURS IF HEADACHE PERSISTS 10 tablet 2   No current facility-administered medications on file prior to visit.    Review of Systems Review of Systems  Constitutional: Negative for fever, appetite change, and unexpected weight change. pos for fatigue and apt to doze off easily Eyes: Negative for pain and visual disturbance.  Respiratory: Negative for cough and shortness of breath.   Cardiovascular: Negative for cp or palpitations    Gastrointestinal: Negative for nausea, diarrhea and constipation.  Genitourinary: Negative for urgency and frequency.  Skin: Negative for pallor or rash   Neurological: Negative for weakness, light-headedness, numbness and  Pos for  headaches.  Hematological: Negative for adenopathy. Does not bruise/bleed easily.  Psychiatric/Behavioral: Negative for dysphoric mood. Pos for symptoms of anx and stressors        Objective:   Physical Exam  Constitutional: He appears well-developed and well-nourished. No distress.  obese and well appearing   HENT:  Head: Normocephalic and atraumatic.  Right Ear: External ear normal.  Left Ear: External ear normal.  Mouth/Throat: Oropharynx is clear and moist.  Nares are boggy TMs are dull   Throat clear with some clear rhinorrhea   Eyes: Conjunctivae and EOM are normal. Pupils are equal, round, and reactive  to light.  Neck: Normal range of motion. Neck supple. No JVD present. Carotid bruit is not present. No thyromegaly present.  Cardiovascular: Normal rate, regular rhythm, normal heart sounds and intact distal pulses.  Exam reveals no gallop.   Pulmonary/Chest: Effort normal and breath sounds normal. No respiratory distress. He has no wheezes. He has no rales.  No crackles  Abdominal: Soft. Bowel sounds are normal. He exhibits no distension, no abdominal bruit and no mass. There is no tenderness.  Musculoskeletal: He exhibits no edema.  Lymphadenopathy:    He has no cervical adenopathy.    Neurological: He is alert. He has normal reflexes. He displays no atrophy. No cranial nerve deficit or sensory deficit. He exhibits normal muscle tone. Coordination and gait normal.  Skin: Skin is warm and dry. No rash noted.  Psychiatric: His speech is normal and behavior is normal. Thought content normal. His mood appears anxious. His affect is not blunt, not labile and not inappropriate. He does not exhibit a depressed mood.          Assessment & Plan:   Problem List Items Addressed This Visit    Allergic rhinitis    Causing congestion/ ear symptoms Recommend non sedating antihistamine and steroid nasal spray Allergen avoidance Update if no improvement or if worse       Migraine - Primary    Not improving with pm gabapentin - but of interest he c/o un restorative sleep even though he is now sleeping Also has loud snoring/ day time somnolence and strong fam hx of sleep apnea Will ref to sleep clinic- sleep apnea could be cause of fatigue and ha  Will continue gabapentin if it helps sleep at all at this point and then re eval        Relevant Orders   Ambulatory referral to Pulmonology   Obesity    Discussed how this problem influences overall health and the risks it imposes  Reviewed plan for weight loss with lower calorie diet (via better food choices and also portion control or program like weight watchers) and exercise building up to or more than 30 minutes 5 days per week including some aerobic activity   Fatigue is limiting exercise - likely that sleep apnea could play a role as well -will proceed with eval and tx of that       Snoring    Loud snoring/ un restorative sleep/chronic headaches /mood problems/wt gain Suspect sleep apnea  Will ref to pulm for eval and treat       Relevant Orders   Ambulatory referral to Pulmonology   Somnolence, daytime   Relevant Orders   Ambulatory referral to Pulmonology   Stress reaction    With chronic fatigue/ and also daily  headache  Offered ref to counseling- he declined for now Enc exercise  Also ref for eval of poss sleep apnea

## 2015-06-07 NOTE — Patient Instructions (Signed)
Keep working on diet and exercise for weight loss I think you may have sleep apnea - that causes fatigue and migraine  Stop at check out for referral to the sleep clinic within pulmonary medicine - for eval for sleep apnea  Continue the gabapentin if it helps you sleep  For allergies and ear popping- get flonase or nasacort over the counter as directed   If you want to see a counselor for stress reaction in the future please let me know

## 2015-06-08 NOTE — Assessment & Plan Note (Signed)
Loud snoring/ un restorative sleep/chronic headaches /mood problems/wt gain Suspect sleep apnea  Will ref to pulm for eval and treat

## 2015-06-08 NOTE — Assessment & Plan Note (Signed)
With chronic fatigue/ and also daily headache  Offered ref to counseling- he declined for now Enc exercise  Also ref for eval of poss sleep apnea

## 2015-06-08 NOTE — Assessment & Plan Note (Signed)
Causing congestion/ ear symptoms Recommend non sedating antihistamine and steroid nasal spray Allergen avoidance Update if no improvement or if worse

## 2015-06-08 NOTE — Assessment & Plan Note (Signed)
Discussed how this problem influences overall health and the risks it imposes  Reviewed plan for weight loss with lower calorie diet (via better food choices and also portion control or program like weight watchers) and exercise building up to or more than 30 minutes 5 days per week including some aerobic activity   Fatigue is limiting exercise - likely that sleep apnea could play a role as well -will proceed with eval and tx of that

## 2015-06-08 NOTE — Assessment & Plan Note (Signed)
Not improving with pm gabapentin - but of interest he c/o un restorative sleep even though he is now sleeping Also has loud snoring/ day time somnolence and strong fam hx of sleep apnea Will ref to sleep clinic- sleep apnea could be cause of fatigue and ha  Will continue gabapentin if it helps sleep at all at this point and then re eval

## 2015-06-19 ENCOUNTER — Ambulatory Visit (INDEPENDENT_AMBULATORY_CARE_PROVIDER_SITE_OTHER): Payer: 59 | Admitting: Internal Medicine

## 2015-06-19 ENCOUNTER — Encounter: Payer: Self-pay | Admitting: Internal Medicine

## 2015-06-19 VITALS — BP 142/86 | HR 79 | Ht 67.0 in | Wt 216.0 lb

## 2015-06-19 DIAGNOSIS — I10 Essential (primary) hypertension: Secondary | ICD-10-CM | POA: Insufficient documentation

## 2015-06-19 DIAGNOSIS — G4719 Other hypersomnia: Secondary | ICD-10-CM | POA: Insufficient documentation

## 2015-06-19 DIAGNOSIS — R0683 Snoring: Secondary | ICD-10-CM | POA: Diagnosis not present

## 2015-06-19 DIAGNOSIS — G43009 Migraine without aura, not intractable, without status migrainosus: Secondary | ICD-10-CM | POA: Diagnosis not present

## 2015-06-19 DIAGNOSIS — J302 Other seasonal allergic rhinitis: Secondary | ICD-10-CM

## 2015-06-19 DIAGNOSIS — E669 Obesity, unspecified: Secondary | ICD-10-CM

## 2015-06-19 DIAGNOSIS — R4189 Other symptoms and signs involving cognitive functions and awareness: Secondary | ICD-10-CM

## 2015-06-19 NOTE — Assessment & Plan Note (Signed)
--  Sleep apnea can contribute to headaches.

## 2015-06-19 NOTE — Patient Instructions (Addendum)
--  You will be going for a sleep study.  --Follow up with [place] 1 to 3 months after your sleep study or sleep titration, whichever is more recent.  --Use rhinocort or flonase over the counter; 2 sprays in each nostril every morning.

## 2015-06-19 NOTE — Progress Notes (Addendum)
[facility] Consultation     Addendum 10/31/2015: Review in summary overall data from titration study: The patient wore the CPAP for a total of 10 out of 30 days, maximum average pressure was 8.6, the patient's apnea popping index remained elevated between 5 and 10 patient can be started on a CPAP level of 8, though the data is suboptimal for this level.   Assessment and Plan:  Snoring -snores loudly at night.  Excessive daytime sleepiness -May be due Obstructive sleep apnea.  Obesity --Weight loss may be beneficial for snoring.   Migraine --Sleep apnea can contribute to headaches.   Essential hypertension -BP was high today, but he does not have this official diagnosis; may be contributed to by obstructive sleep apnea.  Cognitive dysfunction --Has noted trouble with memory and concentration while at work.   Date: 06/19/2015  MRN# [id number] Max Duffy [birth year]  Referring Physician: Roxy Manns A  Max Duffy is a 19 y.o. old male seen in consultation for:  CC:  Chief Complaint  Patient presents with  . Advice Only    snores at night; doesn't feel rested during day; dozes off at work; wakes up 1-2 times nightly sometimes;     HPI:  He has been having very bad migraines for about a year and have been getting them more frequently. This has improved since starting on gabapentin at night. However he notes that he has been out of energy and has not wanted to work out.  He has gained 30 lbs in 6 months. He used to exercise three times per week or more but has not doing that anymore.  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  He dozes while watching tv or listening to music.  He goes to bed at 9 pm, wakes at 6 am, when wakes in am does not feel that he slept well. On weekends he wakes 11 or noon but he will still be tired. He can go to sleep at any time and fall asleep, he  usually falls asleep within a few minutes..  He has a 21 year old that wakes about one or two times per week.  He denies reflux, bizarre dreams, cataplexy.  His mother, father, and grandfather all have sleep apnea.   He does have problems with stuffy nose. He has no known allergies.  He has been taking gabapentin  for the past month and does not feel more tired since starting it.   He notes that he has trouble concentrating and focusing while at work.     PMHX:   Past Medical History  Diagnosis Date  . History of headache    Surgical Hx:  Past Surgical History  Procedure Laterality Date  . Sinus irrigation      age 57  . Knee surgery  2010   Family Hx:  Family History  Problem Relation Age of Onset  . Hypertension Maternal Grandfather   . Diabetes Paternal Grandfather   . Hypertension Maternal Uncle   . Diabetes Father    Social Hx:   Social History  Substance Use Topics  . Smoking status: Former Smoker    Quit date: 11/04/2012  . Smokeless tobacco: None  . Alcohol Use: No   Medication:   Current Outpatient Rx  Name  Route  Sig  Dispense  Refill  . gabapentin (NEURONTIN) 100 MG capsule   Oral  Take 3 capsules (300 mg total) by mouth at bedtime.   90 capsule   3   . SUMAtriptan (IMITREX) 50 MG tablet      TAKE 1 TABLET BY MOUTH ONCE AS NEEDED FOR MIGRAINE. MAY REPEAT IN 2 HOURS IF HEADACHE PERSISTS   10 tablet   2       Allergies:  Review of patient's allergies indicates no known allergies.  Review of Systems: Gen:  Denies  fever, sweats, chills HEENT: Denies blurred vision, double vision,  Cvc:  No dizziness, chest pain or heaviness Resp:   Denies cough or sputum porduction, shortness of breath Gi: Denies swallowing difficulty, stomach pain,  Gu:  Denies bladder incontinence, burning urine Ext:   No Joint pain, stiffness or swelling Skin: No skin rash, easy bruising or bleeding or hives Endoc:  No polyuria, polydipsia , polyphagia or weight  change Psych: No depression, insomnia or hallucinations  Other:  All other systems negative  Physical Examination:   VS: BP 142/86 mmHg  Pulse 79  Ht 5\' 7"  (1.702 m)  Wt 216 lb (97.977 kg)  BMI 33.82 kg/m2  SpO2 97%  General Appearance: No distress  Neuro:without focal findings,  speech normal,  HEENT: PERRLA, EOM intact.  Mallampati 3; neck size 17.  Pulmonary: normal breath sounds, No wheezing, No rales;    CardiovascularNormal S1,S2.  No m/r/g.   Abdomen: Benign, Soft, non-tender. Renal:  No costovertebral tenderness  GU:  No performed at this time. Endoc: No evident thyromegaly, no signs of acromegaly or Cushing features Skin:   warm, no rashes, no ecchymosis  Extremities: normal, no cyanosis, clubbing, no edema, warm with normal capillary refill.         Thank  you for the consultation and for allowing [facility] to assist in the care of your patient. Our recommendations are noted above.  Please contact us if we can be of further service.   Wells Guiles, MD.  Board Certified in Internal Medicine, Pulmonary Medicine, Critical Care Medicine, and Sleep Medicine.   [facility] Office Number: [phone] Santiago Glad, M.D.  Stephanie Acre, M.D.  Carolyne Fiscal, M.D

## 2015-06-19 NOTE — Assessment & Plan Note (Signed)
-  he has noticed problems with memory and concentration at work.

## 2015-06-19 NOTE — Assessment & Plan Note (Signed)
-  snores loudly at night.

## 2015-06-19 NOTE — Assessment & Plan Note (Signed)
--  Weight loss may be beneficial for snoring.

## 2015-06-19 NOTE — Assessment & Plan Note (Signed)
-  May be due Obstructive sleep apnea.

## 2015-06-19 NOTE — Assessment & Plan Note (Signed)
-  may be contributed to by obstructive sleep apnea.

## 2015-06-19 NOTE — Addendum Note (Signed)
Addended by: Meyer Cory R on: 06/19/2015 02:53 PM   Modules accepted: Orders

## 2015-07-14 ENCOUNTER — Other Ambulatory Visit: Payer: Self-pay | Admitting: Family Medicine

## 2015-07-14 NOTE — Telephone Encounter (Signed)
Pt request status of refill for sumatriptin; advised pt refill done and pt will ck with pharmacy.

## 2015-07-14 NOTE — Telephone Encounter (Signed)
Done

## 2015-07-14 NOTE — Telephone Encounter (Signed)
Electronic refill request, last OV 06/07/15, last refilled on 03/02/15 #10 with 2 additional refills

## 2015-07-14 NOTE — Telephone Encounter (Signed)
Please refill times 5 

## 2015-07-19 ENCOUNTER — Telehealth: Payer: Self-pay | Admitting: *Deleted

## 2015-07-19 DIAGNOSIS — G4719 Other hypersomnia: Secondary | ICD-10-CM

## 2015-07-19 DIAGNOSIS — R0683 Snoring: Secondary | ICD-10-CM

## 2015-07-19 NOTE — Telephone Encounter (Signed)
Pt has spoke with Max Duffy. See below:   order a in lab study for him. Study was scheduled at sleep [facility], however, [employer] denied in lab study. I believe sleep med sent inform over yesterday and you placed this in his look at folder. Pt is saying the [facility] said that if DR did a peer to peer they may cover in lab study. I advised patient that HST has already been approved, pt stating that he prefers to do the in lab study to do having a small child at home. I advised patient that usually when that is the case the spouse usually takes care of the child that night and he could sleep in another room. However, this man is pretty demanding that he wants an in lab study.      Please advise.

## 2015-07-19 NOTE — Telephone Encounter (Signed)
Please assure the patient that if his study is positive he will still get an in-lab study.   If his home study does not show any sleep apnea we will go to bat for him against his insurance company. . . But not before that.

## 2015-07-20 NOTE — Telephone Encounter (Signed)
Spoke with pt and informed of DR response. Pt agrees to HST. Order placed. Nothing further needed.

## 2015-07-23 DIAGNOSIS — G4733 Obstructive sleep apnea (adult) (pediatric): Secondary | ICD-10-CM | POA: Diagnosis not present

## 2015-07-25 DIAGNOSIS — G4733 Obstructive sleep apnea (adult) (pediatric): Secondary | ICD-10-CM | POA: Diagnosis not present

## 2015-07-26 ENCOUNTER — Other Ambulatory Visit: Payer: Self-pay | Admitting: *Deleted

## 2015-07-26 DIAGNOSIS — G4719 Other hypersomnia: Secondary | ICD-10-CM

## 2015-07-26 DIAGNOSIS — R0683 Snoring: Secondary | ICD-10-CM

## 2015-07-27 ENCOUNTER — Telehealth: Payer: Self-pay | Admitting: Internal Medicine

## 2015-07-27 DIAGNOSIS — G4733 Obstructive sleep apnea (adult) (pediatric): Secondary | ICD-10-CM

## 2015-07-27 NOTE — Telephone Encounter (Signed)
Max Duffy has spoken with pt today and she printed off the HST for DR to look at. Will forward message to him.

## 2015-07-27 NOTE — Telephone Encounter (Signed)
Test showed mild sleep apnea with an AHI of 5/hour of sleep. Can refer for a CPAP titration study.

## 2015-07-27 NOTE — Telephone Encounter (Signed)
Pt had HST and pt is requesting results. Please advise thanks

## 2015-07-27 NOTE — Telephone Encounter (Signed)
Order placed and pt informed he will be called to be scheduled. Nothing further needed.

## 2015-07-28 ENCOUNTER — Encounter: Payer: Self-pay | Admitting: Family Medicine

## 2015-07-28 ENCOUNTER — Ambulatory Visit (INDEPENDENT_AMBULATORY_CARE_PROVIDER_SITE_OTHER): Payer: 59 | Admitting: Family Medicine

## 2015-07-28 VITALS — BP 136/90 | HR 90 | Temp 98.4°F | Ht 67.0 in | Wt 213.5 lb

## 2015-07-28 DIAGNOSIS — Z6833 Body mass index (BMI) 33.0-33.9, adult: Secondary | ICD-10-CM | POA: Insufficient documentation

## 2015-07-28 DIAGNOSIS — G4733 Obstructive sleep apnea (adult) (pediatric): Secondary | ICD-10-CM | POA: Diagnosis not present

## 2015-07-28 NOTE — Progress Notes (Signed)
Pre visit review using our clinic review tool, if applicable. No additional management support is needed unless otherwise documented below in the visit note. 

## 2015-07-28 NOTE — Progress Notes (Signed)
Subjective:    Patient ID: Max Duffy, male    DOB: Feb 08, 1996, 19 y.o.   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 OSA (obstructive sleep apnea) 07/28/2015  . BMI 33.0-33.9,adult 07/28/2015  . Excessive daytime sleepiness 06/19/2015  . Essential hypertension 06/19/2015  . Cognitive deficits 06/19/2015  . Somnolence, daytime 06/07/2015  . Snoring 06/07/2015  . Allergic rhinitis 06/07/2015  . Stress reaction 06/07/2015  . Constipation 04/21/2015  . Screening for HIV (human immunodeficiency virus) 04/21/2015  . Obesity 04/21/2015  . Elevated transaminase level 12/18/2014  . Sinusitis, chronic 11/15/2014  . Migraine 11/15/2014  . Routine general medical examination at a health care facility 11/15/2014   Past Medical History  Diagnosis Date  . History of headache    Past Surgical History  Procedure Laterality Date  . Sinus irrigation      age 1  . Knee surgery  2010   Social History  Substance Use Topics  . Smoking status: Former Smoker    Quit date: 11/04/2012  . Smokeless tobacco: None  . Alcohol Use: 0.0 oz/week    0 Standard drinks or equivalent per week     Comment: occ/rare   Family History  Problem Relation Age of Onset  . Hypertension Maternal Grandfather   . Diabetes Paternal Grandfather   . Hypertension Maternal Uncle   . Diabetes Father    No Known Allergies Current Outpatient Prescriptions on File Prior to Visit  Medication Sig Dispense Refill  . gabapentin (NEURONTIN) 100 MG capsule Take 3 capsules  (300 mg total) by mouth at bedtime. 90 capsule 3  . SUMAtriptan (IMITREX) 50 MG tablet TAKE 1 TABLET BY MOUTH ONCE AS NEEDED FOR MIGRAINE. MAY REPEAT IN 2 HOURS IF HEADACHE PERSISTS 10 tablet 4   No current facility-administered medications on file prior to visit.    Review of Systems Review of Systems  Constitutional: Negative for fever, appetite change,  and unexpected weight change. pos for fatigue  Eyes: Negative for pain and visual disturbance.  ENT neg for cong or sinus pain  Respiratory: Negative for cough and shortness of breath.   Cardiovascular: Negative for cp or palpitations    Gastrointestinal: Negative for nausea, diarrhea and constipation.  Genitourinary: Negative for urgency and frequency.  Skin: Negative for pallor or rash   Neurological: Negative for weakness, light-headedness, numbness and headaches.  Hematological: Negative for adenopathy. Does not bruise/bleed easily.  Psychiatric/Behavioral: Negative for dysphoric mood. The patient is not nervous/anxious.         Objective:   Physical Exam  Constitutional: He appears well-developed and well-nourished. No distress.  overwt and well appearing   Very muscular build   HENT:  Head: Normocephalic and atraumatic.  Mouth/Throat: Oropharynx is clear and moist.  Eyes: Conjunctivae and EOM are normal. Pupils are equal, round, and reactive to light.  Neck: Normal range of motion. Neck supple. No JVD present. Carotid bruit is  not present. No thyromegaly present.  Cardiovascular: Normal rate, regular rhythm, normal heart sounds and intact distal pulses.  Exam reveals no gallop.   Pulmonary/Chest: Effort normal and breath sounds normal. No respiratory distress. He has no wheezes. He has no rales.  No crackles  Abdominal: Soft. Bowel sounds are normal. He exhibits no distension, no abdominal bruit and no mass. There is no tenderness.  Musculoskeletal: He exhibits no edema.  Lymphadenopathy:    He has no cervical  adenopathy.  Neurological: He is alert. He has normal reflexes.  Skin: Skin is warm and dry. No rash noted.  Psychiatric: He has a normal mood and affect.          Assessment & Plan:   Problem List Items Addressed This Visit      Respiratory   OSA (obstructive sleep apnea) - Primary    Mild apnea found on home study- quite symptomatic however In the process of setting up a facility study- suspect split night Pt is interested in starting cpap  Disc wt and enc further effort for wt loss which will also help         Other   BMI 33.0-33.9,adult    Muscular build falsely elevates bmi Suspect body fat content would not be as high as expected Discussed how this problem influences overall health and the risks it imposes (incl OSA) Reviewed plan for weight loss with lower calorie diet (via better food choices and also portion control or program like weight watchers) and exercise building up to or more than 30 minutes 5 days per week including some aerobic activity    Forms filled out for work

## 2015-07-28 NOTE — Patient Instructions (Signed)
Go forward with the formal sleep study- call the pulmonary office next week if you do not hear from them  Start the exercise program  Take care of yourself

## 2015-07-30 NOTE — Assessment & Plan Note (Signed)
Muscular build falsely elevates bmi Suspect body fat content would not be as high as expected Discussed how this problem influences overall health and the risks it imposes (incl OSA) Reviewed plan for weight loss with lower calorie diet (via better food choices and also portion control or program like weight watchers) and exercise building up to or more than 30 minutes 5 days per week including some aerobic activity    Forms filled out for work

## 2015-07-30 NOTE — Assessment & Plan Note (Signed)
Mild apnea found on home study- quite symptomatic however In the process of setting up a facility study- suspect split night Pt is interested in starting cpap  Disc wt and enc further effort for wt loss which will also help

## 2015-08-30 ENCOUNTER — Telehealth: Payer: Self-pay | Admitting: *Deleted

## 2015-08-30 DIAGNOSIS — G4733 Obstructive sleep apnea (adult) (pediatric): Secondary | ICD-10-CM

## 2015-08-30 NOTE — Telephone Encounter (Signed)
-----   Message from Ollen Grosshonda J Cobb sent at 08/28/2015  8:11 AM EDT ----- Regarding: [employer] Denied in lab CPAP Titration Study [employer] denied in lab CPAP Titration Study after appeal.  Pt can be set up with a home Auto CPAP through DME company.   Please check with Dr. Nicholos Johnsamachandran and see if he will approve CPAP Auto through DME.  Please advise.  Bjorn Loserhonda

## 2015-09-04 NOTE — Telephone Encounter (Signed)
Dr. Ramachandran please advise. 

## 2015-09-06 NOTE — Telephone Encounter (Signed)
Called and spoke with patient and pt is aware that [facility] [place] [facility].  Staff message sent to [facility]. Pt advised that if he didn't hear [facility] in a week to contact us and if he had any issues tolerating the device to advise us as well. Pt voiced understanding and is aware of referral. Ollen Grosshonda J Cobb

## 2015-09-06 NOTE — Telephone Encounter (Signed)
Order for CPAP entered.

## 2015-09-06 NOTE — Telephone Encounter (Signed)
Yes, may send for autotitrating cpap, low pressure=6; high pressure =16.

## 2015-10-13 ENCOUNTER — Encounter: Payer: Self-pay | Admitting: Internal Medicine

## 2015-10-31 ENCOUNTER — Telehealth: Payer: Self-pay | Admitting: Internal Medicine

## 2015-10-31 DIAGNOSIS — G4733 Obstructive sleep apnea (adult) (pediatric): Secondary | ICD-10-CM

## 2015-10-31 NOTE — Telephone Encounter (Signed)
Shane CrutchPradeep Ramachandran, MD  Renea EeMisty R Ahmad, LPN           Start cpap at 8. Follow up in 3 months.     ------------ Patient wants to change out the pillows for a full face mask.  Hard to sleep with the nasal pillows.  Advised patient that I would add that to the order so they can switch out his masks.  Patient wants to wait to schedule follow up appointment when he gets home.  He is currently at work and wants to check his receipt for when he received his CPAP machine to make sure that he schedules the appointment before the 90 days is up from the date of obtaining CPAP.  Will call back to schedule appointment. [facility] to [facility]

## 2015-11-01 NOTE — Telephone Encounter (Signed)
LMTCB for pt 

## 2015-11-02 NOTE — Telephone Encounter (Signed)
appt scheduled.  Nothing further needed.  

## 2015-11-14 ENCOUNTER — Ambulatory Visit: Payer: 59 | Admitting: Internal Medicine

## 2015-11-17 ENCOUNTER — Ambulatory Visit: Payer: 59 | Admitting: Internal Medicine

## 2015-11-27 ENCOUNTER — Encounter: Payer: Self-pay | Admitting: Internal Medicine

## 2015-11-27 ENCOUNTER — Ambulatory Visit (INDEPENDENT_AMBULATORY_CARE_PROVIDER_SITE_OTHER): Payer: 59 | Admitting: Internal Medicine

## 2015-11-27 VITALS — BP 122/64 | HR 60 | Ht 67.0 in | Wt 214.6 lb

## 2015-11-27 DIAGNOSIS — R0683 Snoring: Secondary | ICD-10-CM | POA: Diagnosis not present

## 2015-11-27 DIAGNOSIS — J302 Other seasonal allergic rhinitis: Secondary | ICD-10-CM | POA: Diagnosis not present

## 2015-11-27 DIAGNOSIS — G4733 Obstructive sleep apnea (adult) (pediatric): Secondary | ICD-10-CM

## 2015-11-27 NOTE — Patient Instructions (Signed)
Continue cpap every night.  --Put on the mask every night at bedtime, and take it off in the morning when you wake up.

## 2015-11-27 NOTE — Progress Notes (Signed)
[facility]      Assessment and Plan:  Snoring -snores loudly at night, due to OSA, this has improved since being on CPAP.   Excessive daytime sleepiness -Due to OSA.   - advised to continue nightly use of CPAP , also discussed that if his insurance does not cover his masks , can buy them online.  Obesity --Weight loss may be beneficial for snoring.   Migraine --Sleep apnea can contribute to headaches.   Cognitive dysfunction --Has noted trouble with memory and concentration while at work. This has improved while being on CPAP.    Date: 11/27/2015  MRN# [id number] Max Duffy Oct 24, 1996   Max Duffy is a 20 y.o. old male seen in follow up for chief complaint of  Sleep apnea    HPI:   He notes that he was not using the PAP very regularly at first, but recently he has started using it regularly. He is trying his mom's full face mask and notes that this is much better tolerated.   Review in summary overall data from titration study: The patient wore the CPAP for a total of 10 out of 30 days, maximum average pressure was 8.6, the patient's apnea , hypopnea index remained elevated between 5 and 10 patient can be started on a CPAP level of 8, though the data is suboptimal for this level.  Review of download CPAP data on CPAP @ 8 shows residual AHI of 1.6. However usage was suboptimal  with usage of only 5 days thus far.  Medication:   Outpatient Encounter Prescriptions as of 11/27/2015  Medication Sig  . gabapentin (NEURONTIN) 100 MG capsule Take 3 capsules (300 mg total) by mouth at bedtime.  . SUMAtriptan (IMITREX) 50 MG tablet TAKE 1 TABLET BY MOUTH ONCE AS NEEDED FOR MIGRAINE. MAY REPEAT IN 2 HOURS IF HEADACHE PERSISTS   No facility-administered encounter medications on file as of 11/27/2015.     Allergies:  Review of patient's allergies indicates no known allergies.  Review of Systems: Gen:  Denies  fever, sweats. HEENT: Denies  blurred vision. Cvc:  No dizziness, chest pain or heaviness Resp:   Denies cough or sputum porduction. Gi: Denies swallowing difficulty, stomach pain. constipation, bowel incontinence Gu:  Denies bladder incontinence, burning urine Ext:   No Joint pain, stiffness. Skin: No skin rash, easy bruising. Endoc:  No polyuria, polydipsia. Psych: No depression, insomnia. Other:  All other systems were reviewed and found to be negative other than what is mentioned in the HPI.   Physical Examination:   VS: BP 122/64 mmHg  Pulse 60  Ht  (1.702 m)  Wt 214 lb 9.6 oz (97.342 kg)  BMI 33.60 kg/m2  SpO2 95%  General Appearance: No distress  Neuro:without focal findings,  speech normal,  HEENT: PERRLA, EOM intact. Pulmonary: normal breath sounds, No wheezing.   CardiovascularNormal S1,S2.  No m/r/g.   Abdomen: Benign, Soft, non-tender. Renal:  No costovertebral tenderness  GU:  Not performed at this time. Endoc: No evident thyromegaly, no signs of acromegaly. Skin:   warm, no rash. Extremities: normal, no cyanosis, clubbing.   LABORATORY PANEL:   CBC No results for input(s): WBC, HGB, HCT, PLT in the last 168 hours. ------------------------------------------------------------------------------------------------------------------  Chemistries  No results for input(s): NA, K, CL, CO2, GLUCOSE, BUN, CREATININE, CALCIUM, MG, AST, ALT, ALKPHOS, BILITOT in the last 168 hours.  Invalid input(s): GFRCGP ------------------------------------------------------------------------------------------------------------------  Cardiac Enzymes No results for input(s): TROPONINI in the last  168 hours. ------------------------------------------------------------  RADIOLOGY:   No results found for this or any previous visit. No results found for this or any previous visit. ------------------------------------------------------------------------------------------------------------------  Thank  you  for allowing [facility] to assist in the care of your patient. Our recommendations are noted above.  Please contact us if we can be of further service.   Wells Guiles, MD.  [facility]   Santiago Glad, M.D.  Stephanie Acre, M.D.  Billy Fischer, M.D

## 2015-12-06 ENCOUNTER — Encounter: Payer: Self-pay | Admitting: Family Medicine

## 2015-12-06 ENCOUNTER — Ambulatory Visit (INDEPENDENT_AMBULATORY_CARE_PROVIDER_SITE_OTHER): Payer: 59 | Admitting: Family Medicine

## 2015-12-06 VITALS — BP 122/84 | HR 77 | Temp 98.7°F | Ht 67.0 in | Wt 215.8 lb

## 2015-12-06 DIAGNOSIS — G43009 Migraine without aura, not intractable, without status migrainosus: Secondary | ICD-10-CM | POA: Diagnosis not present

## 2015-12-06 DIAGNOSIS — Z6833 Body mass index (BMI) 33.0-33.9, adult: Secondary | ICD-10-CM

## 2015-12-06 DIAGNOSIS — G4733 Obstructive sleep apnea (adult) (pediatric): Secondary | ICD-10-CM | POA: Diagnosis not present

## 2015-12-06 MED ORDER — SUMATRIPTAN SUCCINATE 50 MG PO TABS: ORAL_TABLET | ORAL | Status: DC

## 2015-12-06 NOTE — Patient Instructions (Signed)
Work on CPAP compliance and hopefully headaches will continue to improve as well as energy level Keep exercising and taking care of yourself  Avoid caffeine  Drink lots of water Keep bed and wake times are the same   Stop at check out -to see if we can move your physical up

## 2015-12-06 NOTE — Progress Notes (Signed)
Pre visit review using our clinic review tool, if applicable. No additional management support is needed unless otherwise documented below in the visit note. 

## 2015-12-06 NOTE — Progress Notes (Signed)
Subjective:    Patient ID: Max Duffy, male    DOB: Jan 23, 1996, 20 y.o.   MRN: [id number]  HPI Here for f/u of chronic medical problems   Stopped the gabapentin - was not helping his headaches   On cpap for his OSA  The nasal pillows did not work out very well  F/u with pulmonary recently  Tried his mother's full face mask - and using it every night - some improvement with that  He follows his sleep with an app   He has to wait a while until he can get a face mask covered -hopefully after feb 10th   Migraine/headache - occurrence varies  0-2 headaches per week  Some improvement with cpap - two  headache in 2 weeks   (a lot better than when it was) Uses sumatriptan when he needs it   Working out -trying to get back in shape  Also joined a soccer team   bmi is 33  HTN BP Readings from Last 3 Encounters:  12/06/15 122/84  11/27/15 122/64  07/28/15 136/90    Is drinking more water  Staying away from caffeine as a rule   Little to none headache - has an occasional drink   Patient Active Problem List   Diagnosis Date Noted  . OSA (obstructive sleep apnea) 07/28/2015  . BMI 33.0-33.9,adult 07/28/2015  . Excessive daytime sleepiness 06/19/2015  . Essential hypertension 06/19/2015  . Cognitive deficits 06/19/2015  . Somnolence, daytime 06/07/2015  . Snoring 06/07/2015  . Allergic rhinitis 06/07/2015  . Stress reaction 06/07/2015  . Constipation 04/21/2015  . Screening for HIV (human immunodeficiency virus) 04/21/2015  . Obesity 04/21/2015  . Elevated transaminase level 12/18/2014  . Sinusitis, chronic 11/15/2014  . Migraine 11/15/2014  . Routine general medical examination at a health care facility 11/15/2014   Past Medical History  Diagnosis Date  . History of headache    Past Surgical History  Procedure Laterality Date  . Sinus irrigation      age 49  . Knee surgery  2010   Social History  Substance Use Topics  . Smoking status: Former Smoker   Quit date: 11/04/2012  . Smokeless tobacco: None  . Alcohol Use: 0.0 oz/week    0 Standard drinks or equivalent per week     Comment: occ/rare   Family History  Problem Relation Age of Onset  . Hypertension Maternal Grandfather   . Diabetes Paternal Grandfather   . Hypertension Maternal Uncle   . Diabetes Father    No Known Allergies No current outpatient prescriptions on file prior to visit.   No current facility-administered medications on file prior to visit.    Review of Systems Review of Systems  Constitutional: Negative for fever, appetite change, and unexpected weight change. pos for fatigue that is improving  Eyes: Negative for pain and visual disturbance.  Respiratory: Negative for cough and shortness of breath.   Cardiovascular: Negative for cp or palpitations    Gastrointestinal: Negative for nausea, diarrhea and constipation.  Genitourinary: Negative for urgency and frequency.  Skin: Negative for pallor or rash   Neurological: Negative for weakness, light-headedness, numbness and pos for headaches.  Hematological: Negative for adenopathy. Does not bruise/bleed easily.  Psychiatric/Behavioral: Negative for dysphoric mood. The patient is not nervous/anxious. Pos for stressors         Objective:   Physical Exam  Constitutional: He is oriented to person, place, and time. He appears well-developed and well-nourished. No distress.  overwt  and well appearing   HENT:  Head: Normocephalic and atraumatic.  Right Ear: External ear normal.  Left Ear: External ear normal.  Nose: Nose normal.  Mouth/Throat: Oropharynx is clear and moist. No oropharyngeal exudate.  No sinus tenderness No temporal tenderness  No TMJ tenderness  Eyes: Conjunctivae and EOM are normal. Pupils are equal, round, and reactive to light. Right eye exhibits no discharge. Left eye exhibits no discharge. No scleral icterus.  No nystagmus  Neck: Normal range of motion and full passive range of motion  without pain. Neck supple. No JVD present. Carotid bruit is not present. No tracheal deviation present. No thyromegaly present.  Cardiovascular: Normal rate, regular rhythm and normal heart sounds.   No murmur heard. Pulmonary/Chest: Effort normal and breath sounds normal. No respiratory distress. He has no wheezes. He has no rales.  Musculoskeletal: He exhibits no edema or tenderness.  Lymphadenopathy:    He has no cervical adenopathy.  Neurological: He is alert and oriented to person, place, and time. He has normal strength and normal reflexes. He displays no atrophy and no tremor. No cranial nerve deficit or sensory deficit. He exhibits normal muscle tone. Coordination and gait normal.  No focal cerebellar signs   Skin: Skin is warm and dry. No rash noted. No pallor.  Psychiatric: He has a normal mood and affect. His behavior is normal. Thought content normal.          Assessment & Plan:   Problem List Items Addressed This Visit      Cardiovascular and Mediastinum   Migraine    This is overall improved with use of cpap  Pt stopped gabapentin - does not feel it kept working  Dose not want prophylactic tx today  Refilled imitrex that works well  If HA 2 per week or more-enc to consider prophylaxis Rev good habits for HA prev - habits are improved       Relevant Medications   SUMAtriptan (IMITREX) 50 MG tablet     Respiratory   OSA (obstructive sleep apnea) - Primary    Gradual improvement with cpap with full face mask Using daily  Enc further exercise/wt loss         Other   BMI 33.0-33.9,adult    Enc to keep working on healthy habits and wt loss  Do not feel BMI accurately represents his status as he has a very muscular build

## 2015-12-07 NOTE — Assessment & Plan Note (Signed)
This is overall improved with use of cpap  Pt stopped gabapentin - does not feel it kept working  Dose not want prophylactic tx today  Refilled imitrex that works well  If HA 2 per week or more-enc to consider prophylaxis Rev good habits for HA prev - habits are improved

## 2015-12-07 NOTE — Assessment & Plan Note (Signed)
Gradual improvement with cpap with full face mask Using daily  Enc further exercise/wt loss

## 2015-12-07 NOTE — Assessment & Plan Note (Signed)
Enc to keep working on healthy habits and wt loss  Do not feel BMI accurately represents his status as he has a very muscular build

## 2015-12-17 ENCOUNTER — Encounter: Payer: Self-pay | Admitting: Internal Medicine

## 2015-12-20 ENCOUNTER — Encounter: Payer: Self-pay | Admitting: Internal Medicine

## 2015-12-20 ENCOUNTER — Ambulatory Visit (INDEPENDENT_AMBULATORY_CARE_PROVIDER_SITE_OTHER): Payer: 59 | Admitting: Internal Medicine

## 2015-12-20 VITALS — BP 130/84 | HR 86 | Temp 98.0°F | Wt 213.0 lb

## 2015-12-20 DIAGNOSIS — J309 Allergic rhinitis, unspecified: Secondary | ICD-10-CM

## 2015-12-20 MED ORDER — CETIRIZINE HCL 10 MG PO TABS: 10.0000 mg | ORAL_TABLET | Freq: Every day | ORAL | Status: DC

## 2015-12-20 MED ORDER — FLUTICASONE PROPIONATE 50 MCG/ACT NA SUSP: 2.0000 | Freq: Every day | NASAL | Status: DC

## 2015-12-20 NOTE — Progress Notes (Signed)
HPI  Pt presents to the clinic today with c/o headache, ear fullness, runny nose and sore throat. This started 4 days ago. He is blowing green blood tinged mucous out of his nose at times. He denies difficulty swallowing. He denies fever, chills or body aches. He has tried Tylenol OTC with minimal relief. He does have a history of sinus issues. He follows with Dr. Jenne Campus but reports he could not get it.  Review of Systems    Past Medical History  Diagnosis Date  . History of headache     Family History  Problem Relation Age of Onset  . Hypertension Maternal Grandfather   . Diabetes Paternal Grandfather   . Hypertension Maternal Uncle   . Diabetes Father     Social History   Social History  . Marital Status: Married    Spouse Name: N/A  . Number of Children: N/A  . Years of Education: N/A   Occupational History  . Not on file.   Social History Main Topics  . Smoking status: Former Smoker    Quit date: 11/04/2012  . Smokeless tobacco: Not on file  . Alcohol Use: 0.0 oz/week    0 Standard drinks or equivalent per week     Comment: occ/rare  . Drug Use: No  . Sexual Activity: Yes   Other Topics Concern  . Not on file   Social History Narrative    No Known Allergies   Constitutional: Positive headache. Denies fatigue, fever or abrupt weight changes.  HEENT:  Positive ear fullness, nasal congestion and sore throat. Denies eye redness, ear pain, ringing in the ears, wax buildup, runny nose or bloody nose. Respiratory:  Denies cough, difficulty breathing or shortness of breath.  Cardiovascular: Denies chest pain, chest tightness, palpitations or swelling in the hands or feet.   No other specific complaints in a complete review of systems (except as listed in HPI above).  Objective:  BP 130/84 mmHg  Pulse 86  Temp(Src) 98 F (36.7 C) (Oral)  Wt 213 lb (96.616 kg)  SpO2 98%   General: Appears his stated age, well developed, well nourished in NAD. HEENT: Head:  normal shape and size, no sinus tenderness noted; Eyes: sclera white, no icterus, conjunctiva pink; Ears: Tm's gray and intact, normal light reflex; Nose: mucosa boggy and moist, septum midline; Throat/Mouth: + PND. Teeth present, mucosa erythematous and moist, no exudate noted, no lesions or ulcerations noted.  Neck:  No masses, lumps or thyromegaly present.  Cardiovascular: Normal rate and rhythm. S1,S2 noted.  No murmur, rubs or gallops noted. Pulmonary/Chest: Normal effort and positive vesicular breath sounds. No respiratory distress. No wheezes, rales or ronchi noted.      Assessment & Plan:   Allergic Rhinitis:  Can use a Neti Pot which can be purchased from your local drug store. Flonase 2 sprays each nostril for 3 days and then as needed. Zyrtec nightly x 1-2 weeks  RTC as needed or if symptoms persist.

## 2015-12-20 NOTE — Progress Notes (Signed)
Pre visit review using our clinic review tool, if applicable. No additional management support is needed unless otherwise documented below in the visit note. 

## 2015-12-22 ENCOUNTER — Encounter: Payer: Self-pay | Admitting: Internal Medicine

## 2015-12-22 NOTE — Patient Instructions (Signed)
Allergic Rhinitis Allergic rhinitis is when the mucous membranes in the nose respond to allergens. Allergens are particles in the air that cause your body to have an allergic reaction. This causes you to release allergic antibodies. Through a chain of events, these eventually cause you to release histamine into the blood stream. Although meant to protect the body, it is this release of histamine that causes your discomfort, such as frequent sneezing, congestion, and an itchy, runny nose.  CAUSES Seasonal allergic rhinitis (hay fever) is caused by pollen allergens that may come from grasses, trees, and weeds. Year-round allergic rhinitis (perennial allergic rhinitis) is caused by allergens such as house dust mites, pet dander, and mold spores. SYMPTOMS  Nasal stuffiness (congestion).  Itchy, runny nose with sneezing and tearing of the eyes. DIAGNOSIS Your health care provider can help you determine the allergen or allergens that trigger your symptoms. If you and your health care provider are unable to determine the allergen, skin or blood testing may be used. Your health care provider will diagnose your condition after taking your health history and performing a physical exam. Your health care provider may assess you for other related conditions, such as asthma, pink eye, or an ear infection. TREATMENT Allergic rhinitis does not have a cure, but it can be controlled by:  Medicines that block allergy symptoms. These may include allergy shots, nasal sprays, and oral antihistamines.  Avoiding the allergen. Hay fever may often be treated with antihistamines in pill or nasal spray forms. Antihistamines block the effects of histamine. There are over-the-counter medicines that may help with nasal congestion and swelling around the eyes. Check with your health care provider before taking or giving this medicine. If avoiding the allergen or the medicine prescribed do not work, there are many new medicines  your health care provider can prescribe. Stronger medicine may be used if initial measures are ineffective. Desensitizing injections can be used if medicine and avoidance does not work. Desensitization is when a patient is given ongoing shots until the body becomes less sensitive to the allergen. Make sure you follow up with your health care provider if problems continue. HOME CARE INSTRUCTIONS It is not possible to completely avoid allergens, but you can reduce your symptoms by taking steps to limit your exposure to them. It helps to know exactly what you are allergic to so that you can avoid your specific triggers. SEEK MEDICAL CARE IF:  You have a fever.  You develop a cough that does not stop easily (persistent).  You have shortness of breath.  You start wheezing.  Symptoms interfere with normal daily activities.   This information is not intended to replace advice given to you by your health care provider. Make sure you discuss any questions you have with your health care provider.   Document Released: 07/16/2001 Document Revised: 11/11/2014 Document Reviewed: 06/28/2013 Elsevier Interactive Patient Education 2016 Elsevier Inc.  

## 2016-01-19 ENCOUNTER — Encounter: Payer: Self-pay | Admitting: Family Medicine

## 2016-01-19 ENCOUNTER — Ambulatory Visit (INDEPENDENT_AMBULATORY_CARE_PROVIDER_SITE_OTHER): Payer: 59 | Admitting: Family Medicine

## 2016-01-19 VITALS — BP 132/70 | HR 71 | Temp 98.2°F | Ht 67.75 in | Wt 221.8 lb

## 2016-01-19 DIAGNOSIS — Z0000 Encounter for general adult medical examination without abnormal findings: Secondary | ICD-10-CM | POA: Diagnosis not present

## 2016-01-19 DIAGNOSIS — I10 Essential (primary) hypertension: Secondary | ICD-10-CM | POA: Diagnosis not present

## 2016-01-19 DIAGNOSIS — Z6833 Body mass index (BMI) 33.0-33.9, adult: Secondary | ICD-10-CM

## 2016-01-19 NOTE — Progress Notes (Signed)
Subjective:    Patient ID: Max Duffy, male    DOB: 05-07-1996, 20 y.o.   MRN: [id number]  HPI Here for health maintenance exam and to review chronic medical problems    Doing ok overall  Some allergy symptoms - last visit -started flonase and zyrtec  Also ear pain - helping some   Wt is up 8 lb with bmi of 33 Muscular build He played indoor soccer - unsure if he will play outdoor  Less working out - very busy schedule   Tdap 10/10 up to date  Does not get flu shots    bp is stable today  No cp or palpitations or headaches or edema  No side effects to medicines  BP Readings from Last 3 Encounters:  01/19/16 136/74  12/20/15 130/84  12/06/15 122/84  132/70 on re check      OSA- on cpap Still using it  Likes the full face mask instead of the nasal   Migraines - doing allright- overall/ about the same occ needs imitrex  Not every day   Hx of elevated transaminases Nl f/u Lab Results  Component Value Date   ALT 31 04/21/2015   AST 22 04/21/2015   ALKPHOS 57 04/21/2015   BILITOT 0.3 04/21/2015  does not use tylenol occ etoh - not often   Also neg hepatitis screen and HIV  Will do blood work today - [facility]   No new family hx   Patient Active Problem List   Diagnosis Date Noted  . OSA (obstructive sleep apnea) 07/28/2015  . BMI 33.0-33.9,adult 07/28/2015  . Excessive daytime sleepiness 06/19/2015  . Essential hypertension 06/19/2015  . Cognitive deficits 06/19/2015  . Somnolence, daytime 06/07/2015  . Snoring 06/07/2015  . Allergic rhinitis 06/07/2015  . Stress reaction 06/07/2015  . Constipation 04/21/2015  . Screening for HIV (human immunodeficiency virus) 04/21/2015  . Obesity 04/21/2015  . Elevated transaminase level 12/18/2014  . Sinusitis, chronic 11/15/2014  . Migraine 11/15/2014  . Routine general medical examination at a health care facility 11/15/2014   Past Medical History  Diagnosis Date  . History of headache    Past  Surgical History  Procedure Laterality Date  . Sinus irrigation      age 89  . Knee surgery  2010   Social History  Substance Use Topics  . Smoking status: Former Smoker    Quit date: 11/04/2012  . Smokeless tobacco: None  . Alcohol Use: 0.0 oz/week    0 Standard drinks or equivalent per week     Comment: occ/rare   Family History  Problem Relation Age of Onset  . Hypertension Maternal Grandfather   . Diabetes Paternal Grandfather   . Hypertension Maternal Uncle   . Diabetes Father    No Known Allergies Current Outpatient Prescriptions on File Prior to Visit  Medication Sig Dispense Refill  . cetirizine (ZYRTEC) 10 MG tablet Take 1 tablet (10 mg total) by mouth daily. 30 tablet 11  . fluticasone (FLONASE) 50 MCG/ACT nasal spray Place 2 sprays into both nostrils daily. 16 g 6  . SUMAtriptan (IMITREX) 50 MG tablet TAKE 1 TABLET BY MOUTH ONCE AS NEEDED FOR MIGRAINE. MAY REPEAT IN 2 HOURS IF HEADACHE PERSISTS 12 tablet 11   No current facility-administered medications on file prior to visit.    Review of Systems Review of Systems  Constitutional: Negative for fever, appetite change, fatigue and unexpected weight change.  Eyes: Negative for pain and visual disturbance.  Respiratory: Negative  for cough and shortness of breath.   Cardiovascular: Negative for cp or palpitations    Gastrointestinal: Negative for nausea, diarrhea and constipation.  Genitourinary: Negative for urgency and frequency.  Skin: Negative for pallor or rash   Neurological: Negative for weakness, light-headedness, numbness and headaches.  Hematological: Negative for adenopathy. Does not bruise/bleed easily.  Psychiatric/Behavioral: Negative for dysphoric mood. The patient is not nervous/anxious.         Objective:   Physical Exam  Constitutional: He appears well-developed and well-nourished. No distress.  overwt and well appearing   HENT:  Head: Normocephalic and atraumatic.  Right Ear: External ear  normal.  Left Ear: External ear normal.  Nose: Nose normal.  Mouth/Throat: Oropharynx is clear and moist.  Eyes: Conjunctivae and EOM are normal. Pupils are equal, round, and reactive to light. Right eye exhibits no discharge. Left eye exhibits no discharge. No scleral icterus.  Neck: Normal range of motion. Neck supple. No JVD present. Carotid bruit is not present. No thyromegaly present.  Cardiovascular: Normal rate, regular rhythm, normal heart sounds and intact distal pulses.  Exam reveals no gallop.   Pulmonary/Chest: Effort normal and breath sounds normal. No respiratory distress. He has no wheezes. He exhibits no tenderness.  Abdominal: Soft. Bowel sounds are normal. He exhibits no distension, no abdominal bruit and no mass. There is no tenderness.  Musculoskeletal: He exhibits no edema or tenderness.  Lymphadenopathy:    He has no cervical adenopathy.  Neurological: He is alert. He has normal reflexes. No cranial nerve deficit. He exhibits normal muscle tone. Coordination normal.  Skin: Skin is warm and dry. No rash noted. No erythema. No pallor.  Some brown nevi and lentigines on back/trunk  Psychiatric: He has a normal mood and affect.          Assessment & Plan:   Problem List Items Addressed This Visit      Cardiovascular and Mediastinum   Essential hypertension    bp in fair control at this time  BP Readings from Last 1 Encounters:  01/19/16 132/70   No changes needed Disc lifstyle change with low sodium diet and exercise   Labs reviewed  Enc wt loss -pt is motivated         Other   BMI 33.0-33.9,adult    Muscular build aff his bmi -but he has gained wt since last visit  Discussed how this problem influences overall health and the risks it imposes  Reviewed plan for weight loss with lower calorie diet (via better food choices and also portion control or program like weight watchers) and exercise building up to or more than 30 minutes 5 days per week including  some aerobic activity         Routine general medical examination at a health care facility - Primary    Reviewed health habits including diet and exercise and skin cancer prevention Reviewed appropriate screening tests for age  Also reviewed health mt list, fam hx and immunization status , as well as social and family history   See HPI Labs ordered Enc wt loss since he has gained since last visit       Relevant Orders   CBC with Differential/Platelet (Completed)   Comprehensive metabolic panel (Completed)   TSH (Completed)   Lipid panel (Completed)

## 2016-01-19 NOTE — Patient Instructions (Signed)
Take care of yourself Take a look at the [employer] PDASH eating plan-it is helpful for blood pressure and weight loss Get back to exercise when you can  Labs today

## 2016-01-19 NOTE — Progress Notes (Signed)
Pre visit review using our clinic review tool, if applicable. No additional management support is needed unless otherwise documented below in the visit note. 

## 2016-01-20 LAB — COMPREHENSIVE METABOLIC PANEL
ALBUMIN: 5 g/dL (ref 3.5–5.5)
ALK PHOS: 78 IU/L (ref 39–117)
ALT: 133 IU/L — ABNORMAL HIGH (ref 0–44)
AST: 62 IU/L — ABNORMAL HIGH (ref 0–40)
Albumin/Globulin Ratio: 2 (ref 1.2–2.2)
BUN / CREAT RATIO: 11 (ref 8–19)
BUN: 10 mg/dL (ref 6–20)
Bilirubin Total: 0.3 mg/dL (ref 0.0–1.2)
CO2: 22 mmol/L (ref 18–29)
CREATININE: 0.94 mg/dL (ref 0.76–1.27)
Calcium: 10 mg/dL (ref 8.7–10.2)
Chloride: 98 mmol/L (ref 96–106)
GFR calc non Af Amer: 115 mL/min/{1.73_m2} (ref >59)
GFR, EST AFRICAN AMERICAN: 133 mL/min/{1.73_m2} (ref >59)
GLUCOSE: 87 mg/dL (ref 65–99)
Globulin, Total: 2.5 g/dL (ref 1.5–4.5)
Potassium: 4.4 mmol/L (ref 3.5–5.2)
Sodium: 138 mmol/L (ref 134–144)
TOTAL PROTEIN: 7.5 g/dL (ref 6.0–8.5)

## 2016-01-20 LAB — CBC WITH DIFFERENTIAL/PLATELET
BASOS: 1 %
Basophils Absolute: 0.1 10*3/uL (ref 0.0–0.2)
EOS (ABSOLUTE): 0.2 10*3/uL (ref 0.0–0.4)
EOS: 3 %
HEMATOCRIT: 43 % (ref 37.5–51.0)
HEMOGLOBIN: 14.7 g/dL (ref 12.6–17.7)
IMMATURE GRANS (ABS): 0 10*3/uL (ref 0.0–0.1)
IMMATURE GRANULOCYTES: 0 %
LYMPHS: 35 %
Lymphocytes Absolute: 3.1 10*3/uL (ref 0.7–3.1)
MCH: 27.9 pg (ref 26.6–33.0)
MCHC: 34.2 g/dL (ref 31.5–35.7)
MCV: 82 fL (ref 79–97)
MONOCYTES: 7 %
Monocytes Absolute: 0.6 10*3/uL (ref 0.1–0.9)
NEUTROS ABS: 4.8 10*3/uL (ref 1.4–7.0)
NEUTROS PCT: 54 %
PLATELETS: 307 10*3/uL (ref 150–379)
RBC: 5.27 x10E6/uL (ref 4.14–5.80)
RDW: 13.2 % (ref 12.3–15.4)
WBC: 8.9 10*3/uL (ref 3.4–10.8)

## 2016-01-20 LAB — LIPID PANEL
Chol/HDL Ratio: 3.6 ratio units (ref 0.0–5.0)
Cholesterol, Total: 136 mg/dL (ref 100–199)
HDL: 38 mg/dL — AB (ref >39)
LDL Calculated: 72 mg/dL (ref 0–99)
Triglycerides: 130 mg/dL (ref 0–149)
VLDL CHOLESTEROL CAL: 26 mg/dL (ref 5–40)

## 2016-01-20 LAB — TSH: TSH: 1.38 u[IU]/mL (ref 0.450–4.500)

## 2016-01-21 NOTE — Assessment & Plan Note (Signed)
Muscular build aff his bmi -but he has gained wt since last visit  Discussed how this problem influences overall health and the risks it imposes  Reviewed plan for weight loss with lower calorie diet (via better food choices and also portion control or program like weight watchers) and exercise building up to or more than 30 minutes 5 days per week including some aerobic activity

## 2016-01-21 NOTE — Assessment & Plan Note (Addendum)
Reviewed health habits including diet and exercise and skin cancer prevention Reviewed appropriate screening tests for age  Also reviewed health mt list, fam hx and immunization status , as well as social and family history   See HPI Labs ordered Enc wt loss since he has gained since last visit

## 2016-01-21 NOTE — Assessment & Plan Note (Signed)
bp in fair control at this time  BP Readings from Last 1 Encounters:  01/19/16 132/70   No changes needed Disc lifstyle change with low sodium diet and exercise   Labs reviewed  Enc wt loss -pt is motivated

## 2016-01-22 ENCOUNTER — Telehealth: Payer: Self-pay | Admitting: Family Medicine

## 2016-01-22 NOTE — Telephone Encounter (Signed)
Pt returned your call.  

## 2016-01-23 ENCOUNTER — Telehealth: Payer: Self-pay | Admitting: Family Medicine

## 2016-01-23 DIAGNOSIS — R7401 Elevation of levels of liver transaminase levels: Secondary | ICD-10-CM

## 2016-01-23 DIAGNOSIS — R740 Nonspecific elevation of levels of transaminase and lactic acid dehydrogenase [LDH]: Principal | ICD-10-CM

## 2016-01-23 IMAGING — CT CT PARANASAL SINUSES W/O CM
3 series · 15 of 47 positions shown, 18 images · non-contrast
Comparison: Report of MRI brain 07/21/2003

CLINICAL DATA: Sinus pressure, worse on the right.  Headaches.

EXAM:
CT PARANASAL SINUS WITHOUT CONTRAST
TECHNIQUE: Multidetector CT images of the paranasal sinuses were obtained using
the standard protocol without intravenous contrast.

[Series 3: ax soft · axial · 0.31mm/px · z∈[-125,-4]mm · 9 of 141 slices shown, 12 images]
[im 10/141  brain]
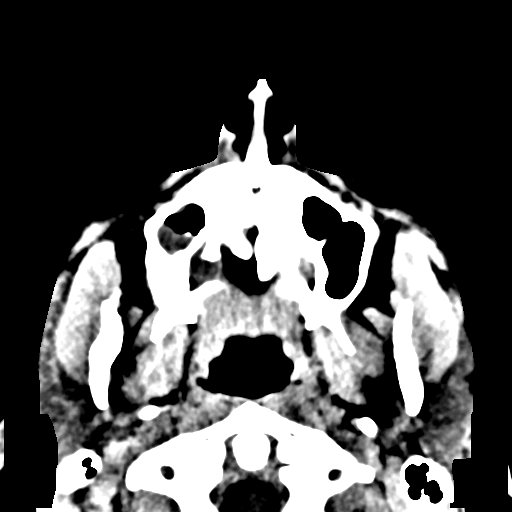
[im 10/141  bone]
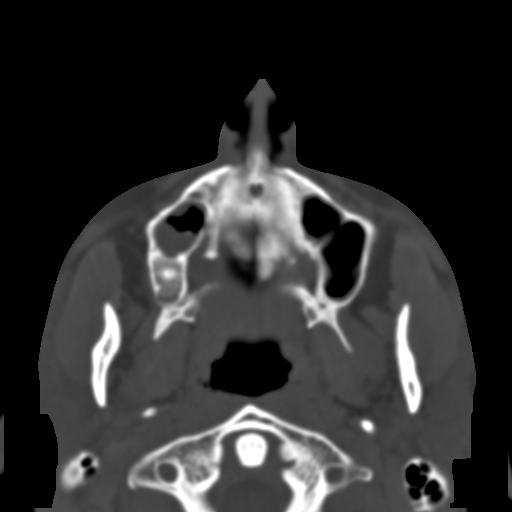
[im 25/141  bone]
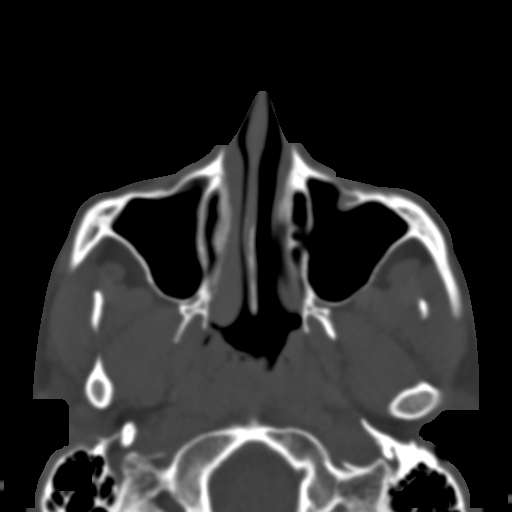
[im 39/141  bone]
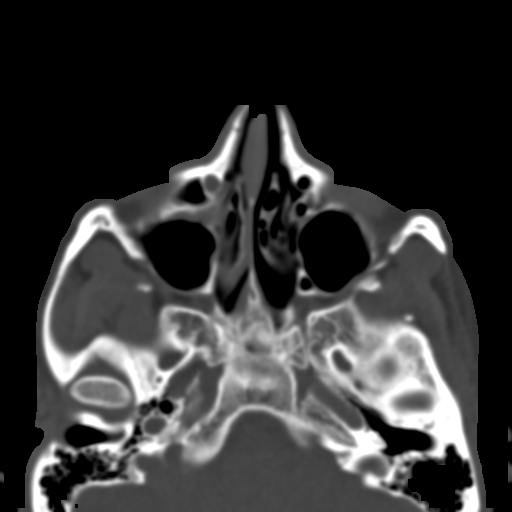
[im 54/141  bone]
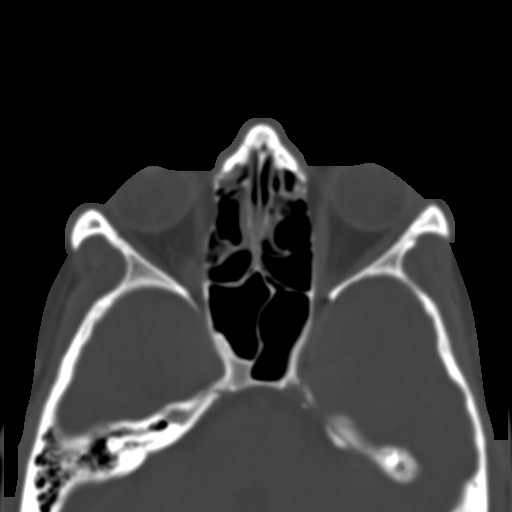
[im 73/141  brain]
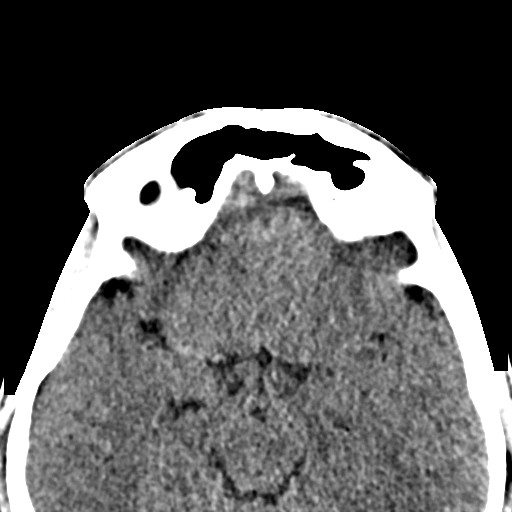
[im 73/141  bone]
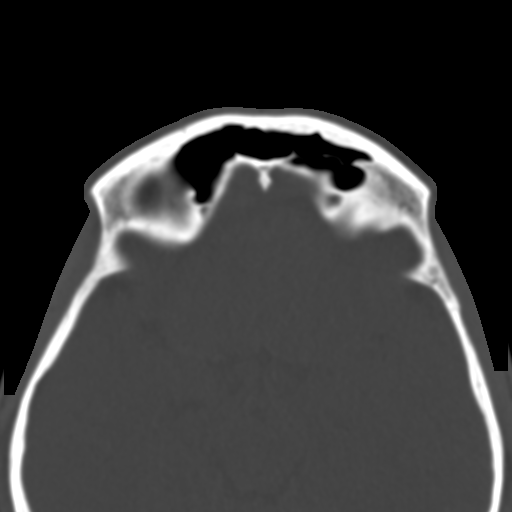
[im 87/141  bone]
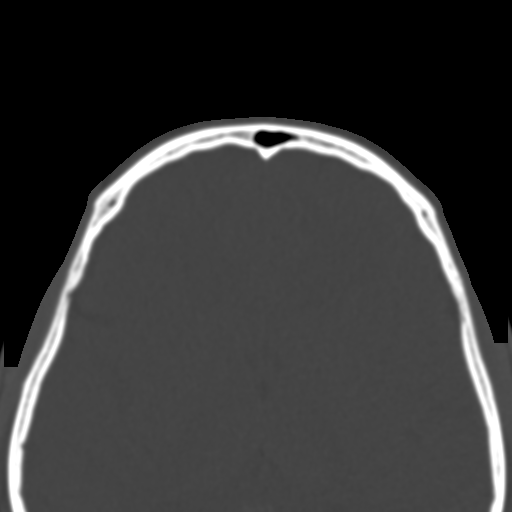
[im 102/141  bone]
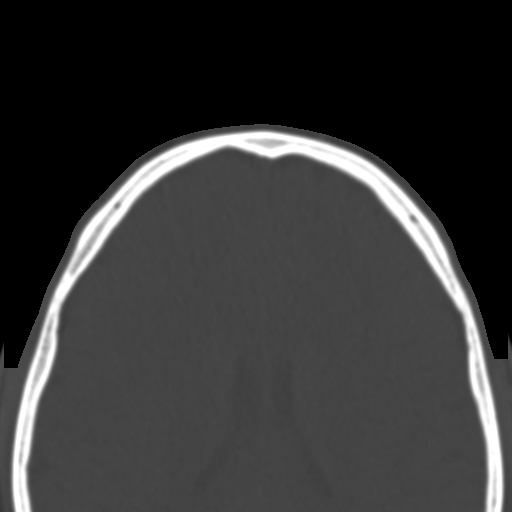
[im 116/141  bone]
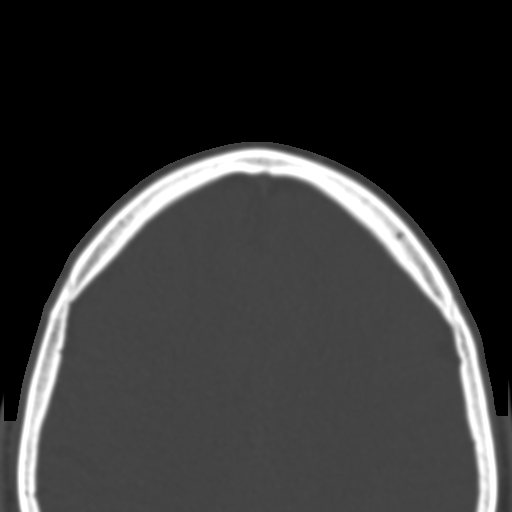
[im 131/141  brain]
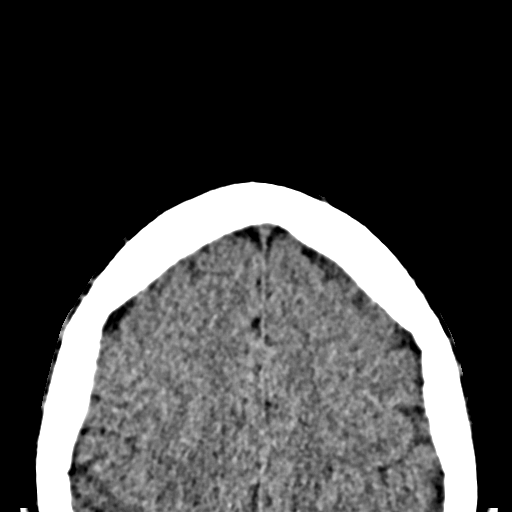
[im 131/141  bone]
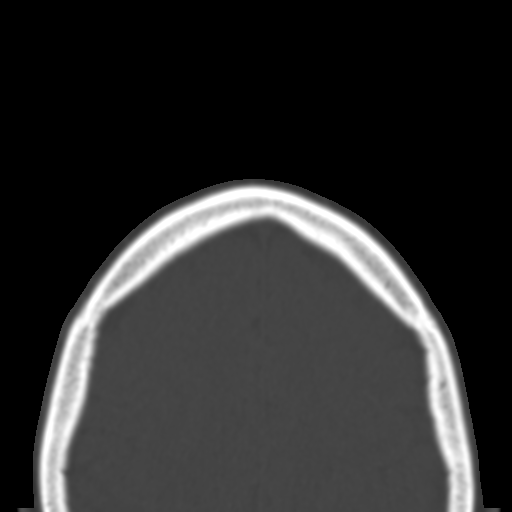

[Series 4: coronal bone · coronal · 0.28mm/px · 3 of 74 slices shown]
[im 25/74  bone]
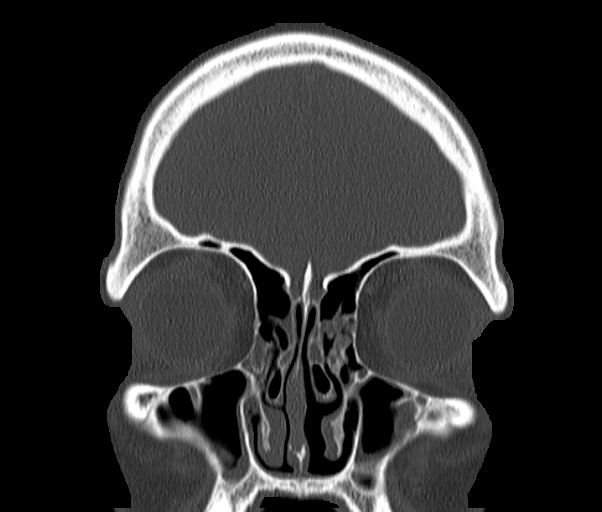
[im 33/74  bone]
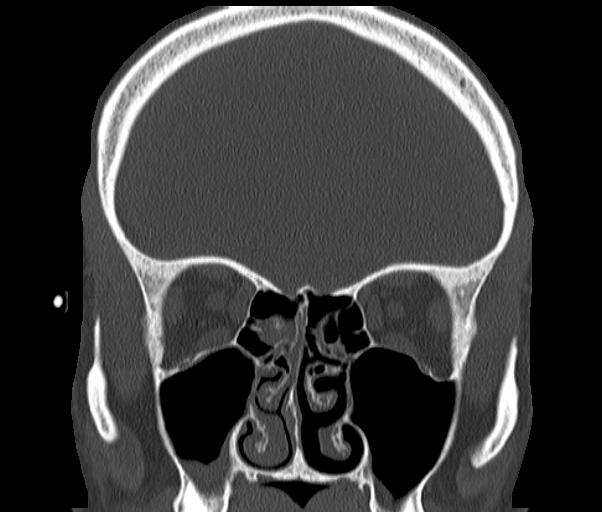
[im 41/74  bone]
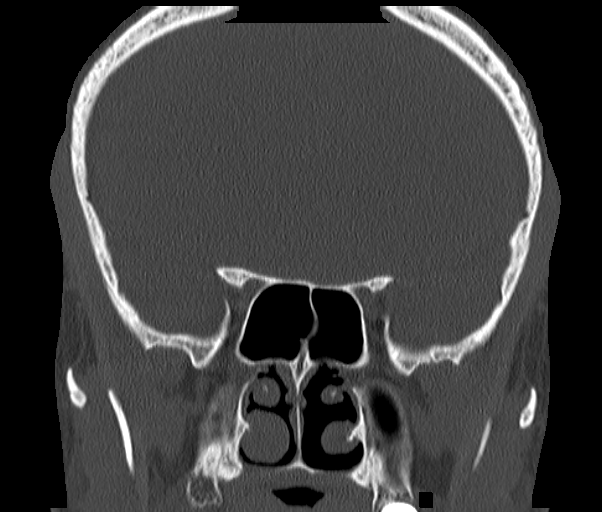

[Series 5: sagittal bone · sagittal · 0.28mm/px · 3 of 81 slices shown]
[im 27/81  bone]
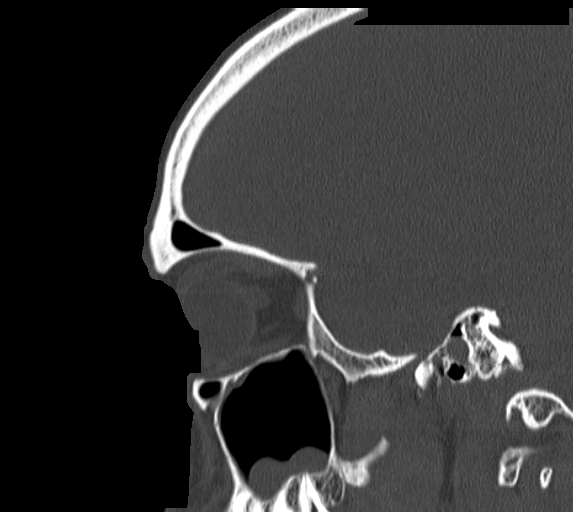
[im 41/81  bone]
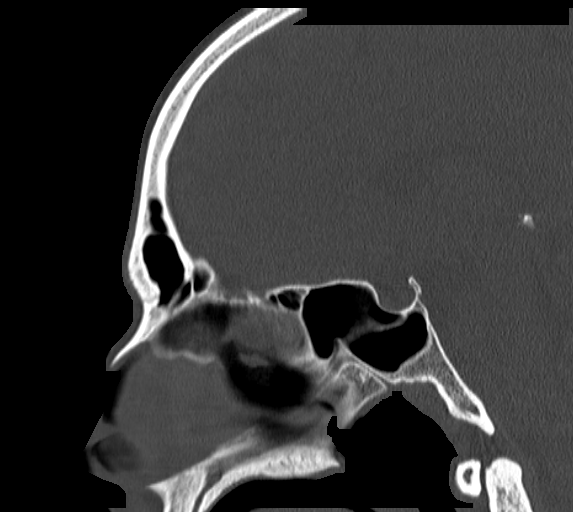
[im 54/81  bone]
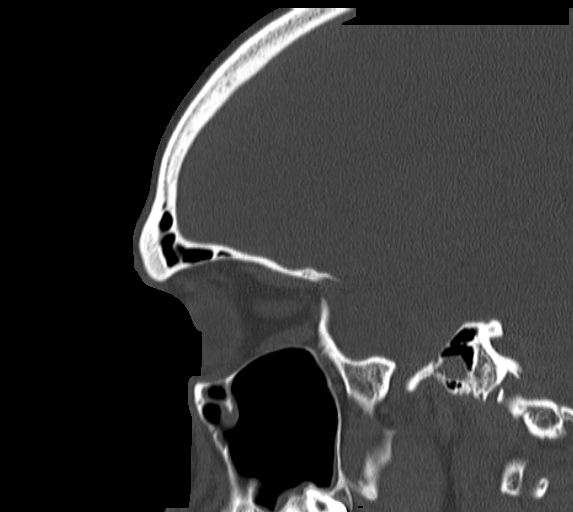

[15 of 47 positions shown; findings below may reference images not displayed]

FINDINGS: Mild circumferential mucosal thickening is present in the maxillary
sinuses bilaterally, right greater than left. There is mucosal
thickening or soft tissue within the ostiomeatal complex
bilaterally. A prominent Haller cell and ethmoid bulla deviate the
ostiomeatal complex medially on both sides. There is a concha
bullosa within the middle turbinate bilaterally as well. Slight
rightward nasal septal deviation measures 3 mm. The nasal cavity is
clear. Mild mucosal thickening is evident throughout the ethmoid air
cells. The frontal sinuses and sphenoid sinuses are clear. The
mastoid air cells are clear
IMPRESSION: 1. Mucosal thickening or soft tissue obstructing the ostiomeatal
complex bilaterally.
2. Medial deviation of the ostiomeatal complex bilaterally with
prominent Haller cells and ethmoid bulla.
3. Concha bullosa within the middle turbinates bilaterally.
4. 3 mm rightward nasal septal deviation.
5. Circumferential mucosal thickening in the maxillary sinuses is
worse on the right.
6. Mild mucosal thickening throughout the ethmoid air cells.

## 2016-01-23 NOTE — Telephone Encounter (Signed)
Addressed through result notes  

## 2016-01-23 NOTE — Telephone Encounter (Signed)
Ordering US abdomen for elevated liver enzymes

## 2016-01-23 NOTE — Telephone Encounter (Signed)
-----   Message from Shon MilletShapale M [place], [place] sent at 01/23/2016 11:44 AM EDT ----- Pt agrees with US order, I [facility], please put order in

## 2016-01-23 NOTE — Telephone Encounter (Signed)
Opened in error

## 2016-01-29 ENCOUNTER — Ambulatory Visit
Admission: RE | Admit: 2016-01-29 | Discharge: 2016-01-29 | Disposition: A | Discharge status: Home / Self Care | Payer: 59 | Source: Ambulatory Visit | Attending: Family Medicine | Admitting: Family Medicine

## 2016-01-29 DIAGNOSIS — R740 Nonspecific elevation of levels of transaminase and lactic acid dehydrogenase [LDH]: Secondary | ICD-10-CM | POA: Diagnosis present

## 2016-01-29 DIAGNOSIS — R7401 Elevation of levels of liver transaminase levels: Secondary | ICD-10-CM

## 2016-02-09 ENCOUNTER — Other Ambulatory Visit (INDEPENDENT_AMBULATORY_CARE_PROVIDER_SITE_OTHER): Payer: 59

## 2016-02-09 DIAGNOSIS — R748 Abnormal levels of other serum enzymes: Secondary | ICD-10-CM | POA: Diagnosis not present

## 2016-02-09 LAB — HEPATIC FUNCTION PANEL
ALBUMIN: 4.9 g/dL (ref 3.5–5.2)
ALT: 42 U/L (ref 0–53)
AST: 38 U/L — ABNORMAL HIGH (ref 0–37)
Alkaline Phosphatase: 62 U/L (ref 39–117)
BILIRUBIN TOTAL: 0.4 mg/dL (ref 0.2–1.2)
Bilirubin, Direct: 0.1 mg/dL (ref 0.0–0.3)
Total Protein: 7.8 g/dL (ref 6.0–8.3)

## 2016-04-22 ENCOUNTER — Encounter: Payer: 59 | Admitting: Family Medicine

## 2016-04-26 ENCOUNTER — Encounter: Payer: 59 | Admitting: Family Medicine

## 2016-05-29 ENCOUNTER — Encounter: Payer: 59 | Admitting: Family Medicine

## 2016-08-24 IMAGING — US US ABDOMEN LIMITED
1 series · 14 of 25 positions shown · non-contrast
Comparison: None.

CLINICAL DATA: Elevated LFTs

EXAM:
US ABDOMEN LIMITED - RIGHT UPPER QUADRANT

[Series 1: us abdomen limited · 0.23mm/px · 14 of 53 slices shown]
[im 1/53]
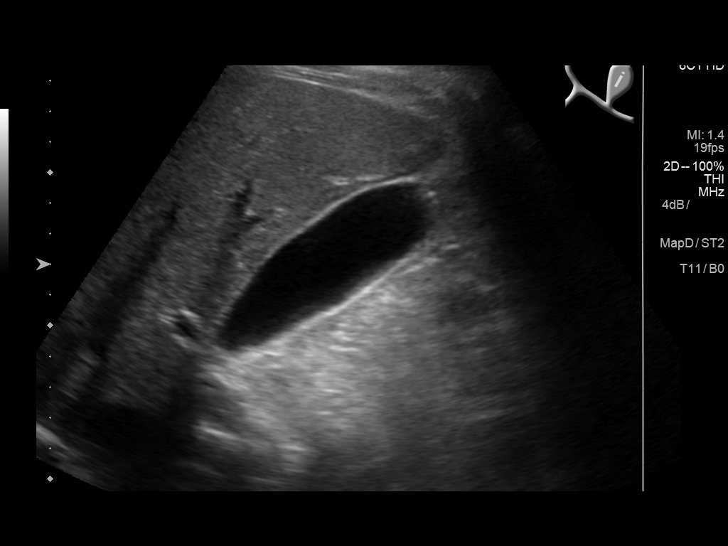
[im 5/53]
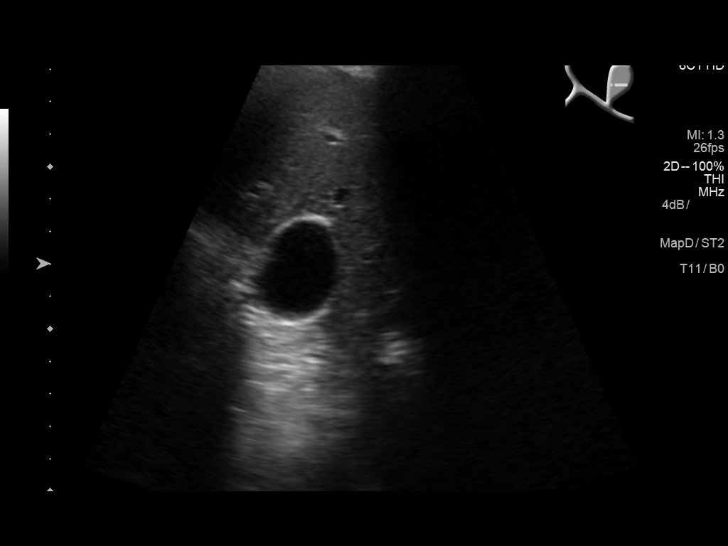
[im 9/53]
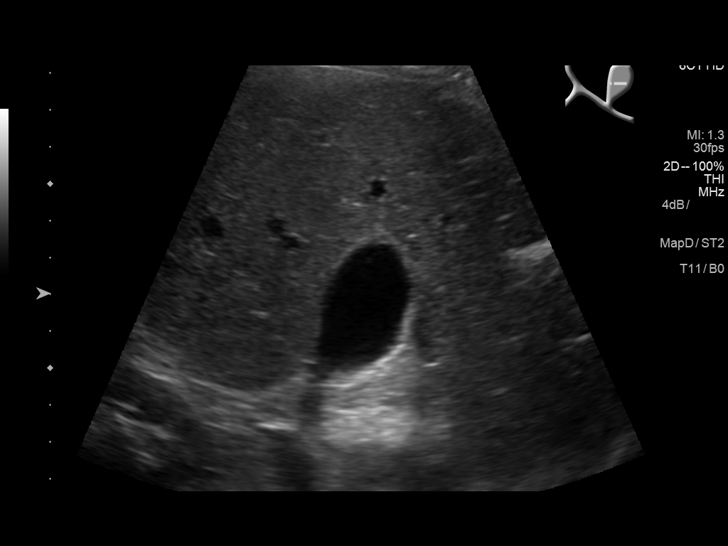
[im 14/53]
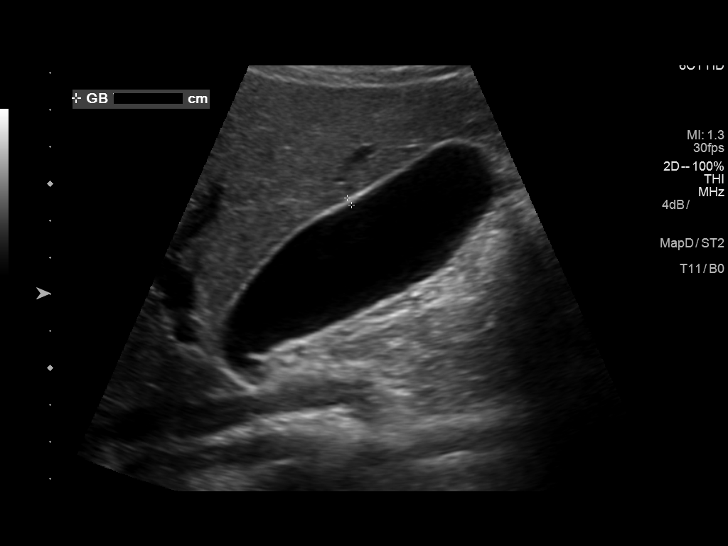
[im 18/53]
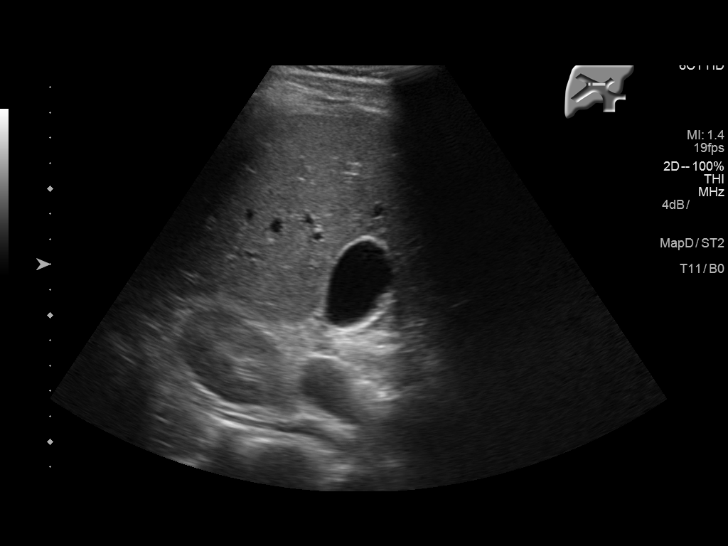
[im 20/53]
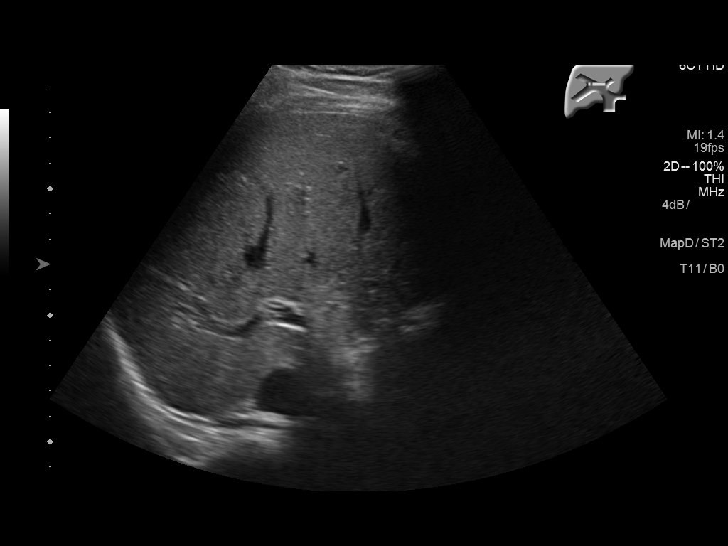
[im 24/53]
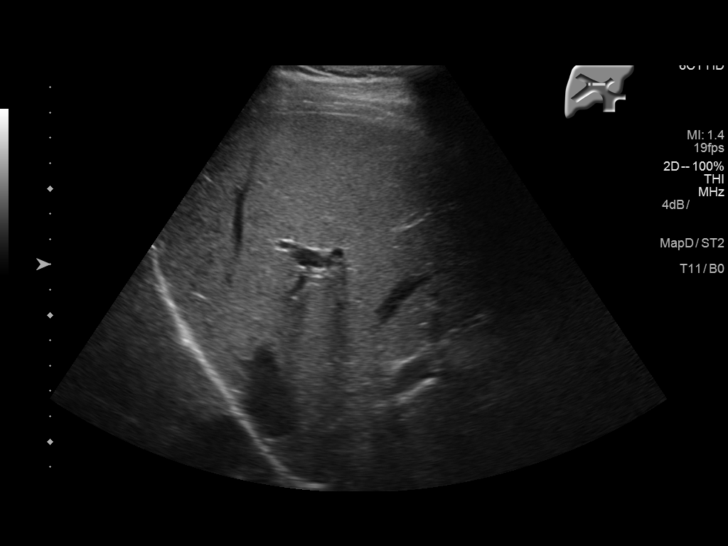
[im 29/53]
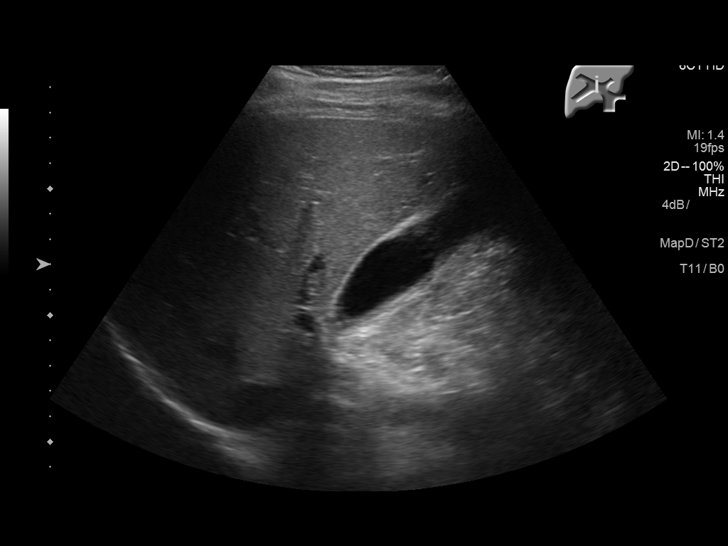
[im 33/53]
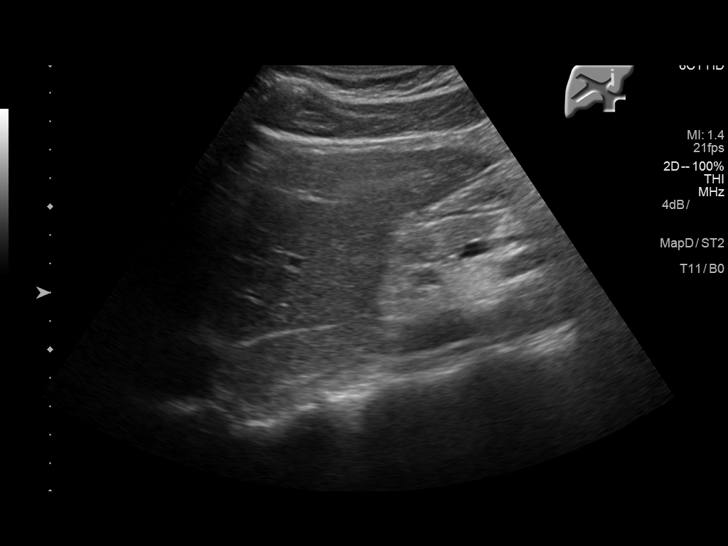
[im 35/53]
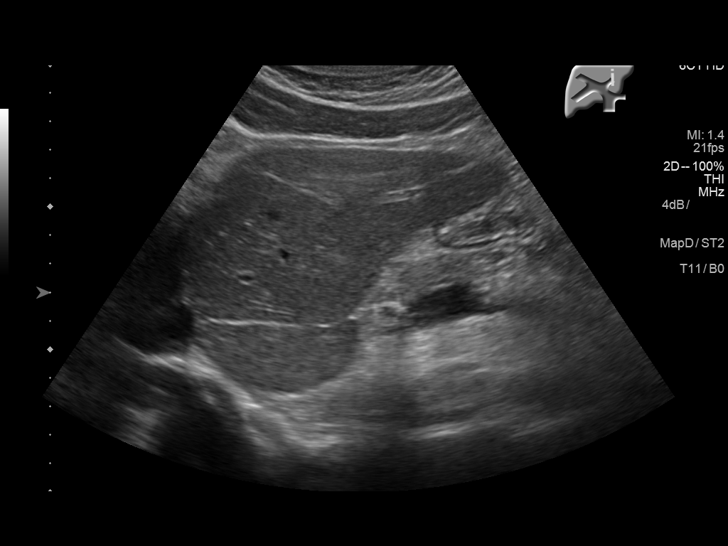
[im 40/53]
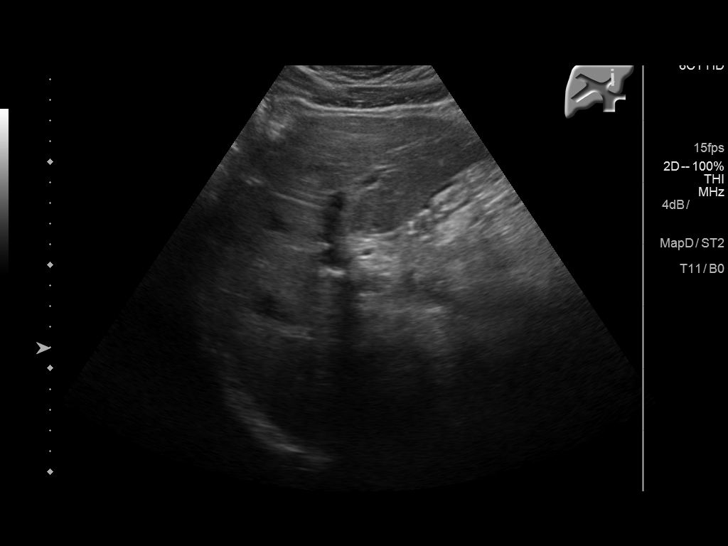
[im 44/53]
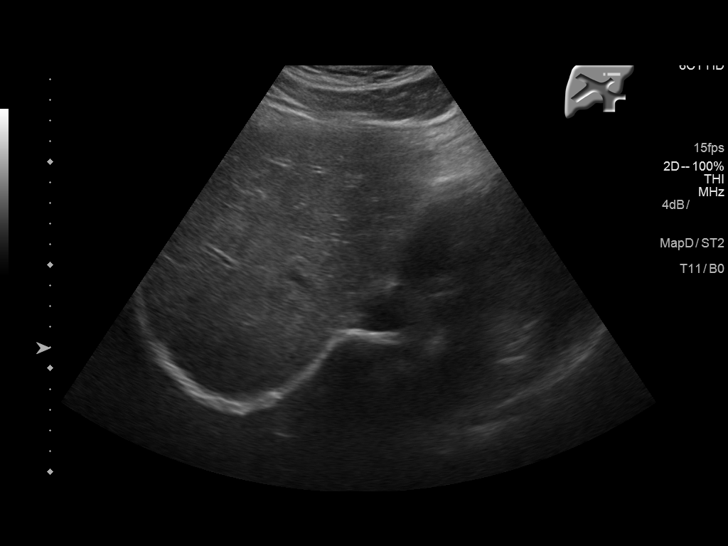
[im 48/53]
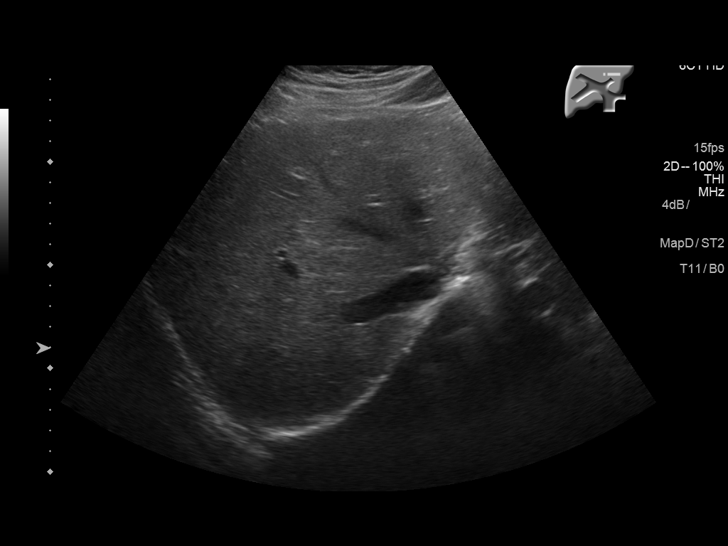
[im 53/53]
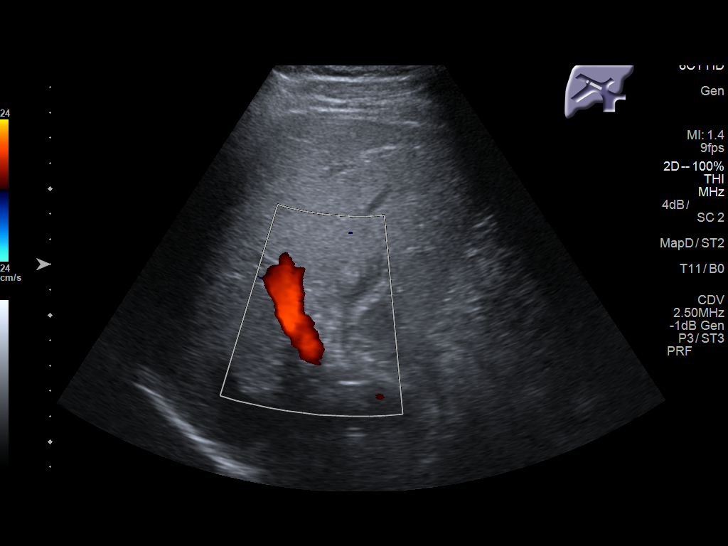

[14 of 25 positions shown; findings below may reference images not displayed]

FINDINGS: Gallbladder:

No gallstones or wall thickening visualized. No sonographic Murphy
sign noted by sonographer.

Common bile duct:

Diameter: Normal caliber, 3 mm

Liver:

No focal lesion identified. Within normal limits in parenchymal
echogenicity.
IMPRESSION: Unremarkable right upper quadrant ultrasound.

## 2016-12-29 ENCOUNTER — Other Ambulatory Visit: Payer: Self-pay | Admitting: Family Medicine

## 2016-12-30 NOTE — Telephone Encounter (Signed)
Please schedule f/u in April or may and refill until then 

## 2016-12-30 NOTE — Telephone Encounter (Signed)
Pt had CPE on 01/09/16 and no future appts., last filled on 12/06/15 #12 tabs with 11 additional refills, please advise

## 2016-12-31 NOTE — Telephone Encounter (Signed)
Left voicemail requesting pt to call the office back 

## 2017-01-07 NOTE — Telephone Encounter (Signed)
appt scheduled and med refilled 

## 2017-01-15 ENCOUNTER — Encounter: Payer: Self-pay | Admitting: Family Medicine

## 2017-01-15 ENCOUNTER — Ambulatory Visit (INDEPENDENT_AMBULATORY_CARE_PROVIDER_SITE_OTHER): Payer: 59 | Admitting: Family Medicine

## 2017-01-15 ENCOUNTER — Encounter (INDEPENDENT_AMBULATORY_CARE_PROVIDER_SITE_OTHER): Payer: Self-pay

## 2017-01-15 VITALS — BP 126/80 | HR 76 | Temp 98.0°F | Ht 67.75 in | Wt 223.0 lb

## 2017-01-15 DIAGNOSIS — R740 Nonspecific elevation of levels of transaminase and lactic acid dehydrogenase [LDH]: Secondary | ICD-10-CM | POA: Diagnosis not present

## 2017-01-15 DIAGNOSIS — G43009 Migraine without aura, not intractable, without status migrainosus: Secondary | ICD-10-CM | POA: Diagnosis not present

## 2017-01-15 DIAGNOSIS — I10 Essential (primary) hypertension: Secondary | ICD-10-CM | POA: Diagnosis not present

## 2017-01-15 DIAGNOSIS — E6609 Other obesity due to excess calories: Secondary | ICD-10-CM

## 2017-01-15 DIAGNOSIS — R7401 Elevation of levels of liver transaminase levels: Secondary | ICD-10-CM

## 2017-01-15 DIAGNOSIS — G4733 Obstructive sleep apnea (adult) (pediatric): Secondary | ICD-10-CM | POA: Diagnosis not present

## 2017-01-15 DIAGNOSIS — Z6834 Body mass index (BMI) 34.0-34.9, adult: Secondary | ICD-10-CM

## 2017-01-15 DIAGNOSIS — R0981 Nasal congestion: Secondary | ICD-10-CM

## 2017-01-15 MED ORDER — SUMATRIPTAN SUCCINATE 50 MG PO TABS: ORAL_TABLET | ORAL | 11 refills | Status: DC

## 2017-01-15 NOTE — Patient Instructions (Addendum)
Start packing a lunch -stay away from fast food   Stay active  We will call you regarding the ENT referral   Talk to your doctor about changing sleep apnea mask to a mouth/nose face mask   Labs for liver today   Keep following good headache rules and a better diet may help  Avoid caffeine and headaches  Try to get enough sleep and get back on CPAP

## 2017-01-15 NOTE — Progress Notes (Signed)
Subjective:    Patient ID: Max Duffy, male    DOB: Jan 12, 1996, 21 y.o.   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)  No longer at [employer] - did not like sitting at a desk    Uses cpap for OSA- but not all the time  Chronic nasal cong Had surgery as a child Would like to f/u ENT - has seen Dr Jenne Campus   Hx of elevated transaminases in the past Lab Results  Component Value Date   ALT 42 02/09/2016   AST 38 (H) 02/09/2016   ALKPHOS 62 02/09/2016   BILITOT 0.4 02/09/2016   Hx of migraine  Not needing the sumatriptan as often  Usually worse when the weather changes   He has not been drinking alcohol No tylenol products   Avoids caffeine  Good sleep hygeine   Patient Active Problem List   Diagnosis Date Noted  . Chronic nasal congestion 01/15/2017  . OSA (obstructive sleep apnea) 07/28/2015  . Excessive daytime sleepiness 06/19/2015  . Essential hypertension 06/19/2015  . Cognitive deficits 06/19/2015  . Somnolence, daytime 06/07/2015  . Snoring 06/07/2015  . Allergic rhinitis 06/07/2015  . Stress reaction 06/07/2015  . Constipation 04/21/2015  . Screening for HIV (human immunodeficiency virus) 04/21/2015  . Obesity 04/21/2015  . Elevated transaminase level 12/18/2014  . Sinusitis, chronic 11/15/2014  . Migraine 11/15/2014  . Routine general medical  examination at a health care facility 11/15/2014   Past Medical History:  Diagnosis Date  . History of headache    Past Surgical History:  Procedure Laterality Date  . KNEE SURGERY  2010  . SINUS IRRIGATION     age 35   Social History  Substance Use Topics  . Smoking status: Former Smoker    Quit date: 11/04/2012  . Smokeless tobacco: Never Used  . Alcohol use 0.0 oz/week     Comment: occ/rare   Family History  Problem Relation Age of Onset  . Hypertension Maternal Grandfather   . Diabetes Paternal Grandfather   . Hypertension Maternal Uncle   . Diabetes Father    No Known Allergies Current Outpatient Prescriptions on File Prior to Visit  Medication Sig Dispense Refill  . cetirizine (ZYRTEC) 10 MG tablet Take 1 tablet (10 mg total) by mouth daily. (Patient not taking: Reported on 01/15/2017) 30 tablet 11  . fluticasone (FLONASE) 50 MCG/ACT nasal spray Place 2 sprays into both nostrils daily. (Patient not taking: Reported on 01/15/2017) 16 g 6   No current facility-administered medications on file prior to visit.     Review of Systems    Review of Systems  Constitutional: Negative for fever, appetite change, and unexpected weight change. pos for fatigue Eyes: Negative for pain  and visual disturbance.  ENT pos for chronic nasal congestion that is worse lately , neg for sinus pain Respiratory: Negative for cough and shortness of breath.   Cardiovascular: Negative for cp or palpitations    Gastrointestinal: Negative for nausea, diarrhea and constipation.  Genitourinary: Negative for urgency and frequency.  Skin: Negative for pallor or rash   Neurological: Negative for weakness, light-headedness, numbness and pos for migraine headaches.  Hematological: Negative for adenopathy. Does not bruise/bleed easily.  Psychiatric/Behavioral: Negative for dysphoric mood. The patient is not nervous/anxious.      Objective:   Physical Exam  Constitutional: He appears well-developed and  well-nourished. No distress.  obese and well appearing Also muscular build  HENT:  Head: Normocephalic and atraumatic.  Right Ear: External ear normal.  Mouth/Throat: No oropharyngeal exudate.  Nares are congested bilaterally with poor air flow   No sinus or temporal tenderness  Eyes: Conjunctivae and EOM are normal. Pupils are equal, round, and reactive to light. Right eye exhibits no discharge. Left eye exhibits no discharge. No scleral icterus.  Neck: Normal range of motion. Neck supple. No JVD present. Carotid bruit is not present. No thyromegaly present.  Cardiovascular: Normal rate, regular rhythm, normal heart sounds and intact distal pulses.  Exam reveals no gallop.   Pulmonary/Chest: Effort normal and breath sounds normal. No respiratory distress. He has no wheezes. He has no rales.  No crackles  Abdominal: Soft. Bowel sounds are normal. He exhibits no distension, no abdominal bruit and no mass. There is no tenderness.  Musculoskeletal: He exhibits no edema or tenderness.  Lymphadenopathy:    He has no cervical adenopathy.  Neurological: He is alert. He has normal reflexes. He displays no atrophy. No cranial nerve deficit or sensory deficit. He exhibits normal muscle tone. Coordination and gait normal.  Skin: Skin is warm and dry. No rash noted. No pallor.  Psychiatric: He has a normal mood and affect.  Nl affect Talkative and pleasant           Assessment & Plan:   Problem List Items Addressed This Visit      Cardiovascular and Mediastinum   Essential hypertension - Primary    BP: 126/80  Still well controlled with diet/lifestyle habits Enc a regular exercise program      Migraine    Doing a bit better/not needing a tryptan as often Disc imp of regular sleep and use of cpap (needs a new mask) Fluid intake/ avoidance of analgesics and etoh Healthier diet enc       Relevant Medications   SUMAtriptan (IMITREX) 50 MG tablet     Respiratory   OSA (obstructive  sleep apnea)    Pt will call about getting a face mask since his chronic congestion prevents affective use        Other   Chronic nasal congestion    Ref to his ENT for eval-this is more bothersome and may add to migraines and issues with cpap       Relevant Orders   Ambulatory referral to ENT   Elevated transaminase level    Re check today  Diet is fair/some wt gain  Little to no alcohol No acetaminophen  Lab today      Relevant Orders   Comprehensive metabolic panel   Obesity    Discussed how this problem influences overall health and the risks it imposes  Reviewed plan for weight loss with lower calorie diet (via better food choices and also portion control or program  like weight watchers) and exercise building up to or more than 30 minutes 5 days per week including some aerobic activity   Will try to get away from fast food

## 2017-01-16 LAB — COMPREHENSIVE METABOLIC PANEL
ALBUMIN: 4.9 g/dL (ref 3.5–5.2)
ALK PHOS: 45 U/L (ref 39–117)
ALT: 53 U/L (ref 0–53)
AST: 35 U/L (ref 0–37)
BUN: 14 mg/dL (ref 6–23)
CO2: 28 mEq/L (ref 19–32)
Calcium: 10.1 mg/dL (ref 8.4–10.5)
Chloride: 106 mEq/L (ref 96–112)
Creatinine, Ser: 1.07 mg/dL (ref 0.40–1.50)
GFR: 91.11 mL/min (ref >60.00)
Glucose, Bld: 81 mg/dL (ref 70–99)
POTASSIUM: 4.2 meq/L (ref 3.5–5.1)
SODIUM: 141 meq/L (ref 135–145)
TOTAL PROTEIN: 7.6 g/dL (ref 6.0–8.3)
Total Bilirubin: 0.4 mg/dL (ref 0.2–1.2)

## 2017-01-16 NOTE — Assessment & Plan Note (Signed)
Re check today  Diet is fair/some wt gain  Little to no alcohol No acetaminophen  Lab today

## 2017-01-16 NOTE — Assessment & Plan Note (Signed)
Discussed how this problem influences overall health and the risks it imposes  Reviewed plan for weight loss with lower calorie diet (via better food choices and also portion control or program like weight watchers) and exercise building up to or more than 30 minutes 5 days per week including some aerobic activity   Will try to get away from fast food

## 2017-01-16 NOTE — Assessment & Plan Note (Signed)
Doing a bit better/not needing a tryptan as often Disc imp of regular sleep and use of cpap (needs a new mask) Fluid intake/ avoidance of analgesics and etoh Healthier diet enc

## 2017-01-16 NOTE — Assessment & Plan Note (Signed)
BP: 126/80  Still well controlled with diet/lifestyle habits Enc a regular exercise program

## 2017-01-16 NOTE — Assessment & Plan Note (Signed)
Ref to his ENT for eval-this is more bothersome and may add to migraines and issues with cpap

## 2017-01-16 NOTE — Assessment & Plan Note (Signed)
Pt will call about getting a face mask since his chronic congestion prevents affective use

## 2017-01-17 ENCOUNTER — Encounter: Payer: Self-pay | Admitting: *Deleted

## 2017-08-19 ENCOUNTER — Ambulatory Visit (INDEPENDENT_AMBULATORY_CARE_PROVIDER_SITE_OTHER): Payer: 59 | Admitting: Family Medicine

## 2017-08-19 ENCOUNTER — Encounter: Payer: Self-pay | Admitting: Family Medicine

## 2017-08-19 VITALS — BP 128/82 | HR 70 | Temp 98.4°F | Wt 219.8 lb

## 2017-08-19 DIAGNOSIS — L298 Other pruritus: Secondary | ICD-10-CM | POA: Diagnosis not present

## 2017-08-19 DIAGNOSIS — B356 Tinea cruris: Secondary | ICD-10-CM | POA: Insufficient documentation

## 2017-08-19 MED ORDER — KETOCONAZOLE 2 % EX CREA: 1.0000 "application " | TOPICAL_CREAM | Freq: Every day | CUTANEOUS | 3 refills | Status: DC

## 2017-08-19 NOTE — Patient Instructions (Signed)
You have jock itch and also some athletes's  Foot Clean and dry areas very well (use hair dryer on cool)  Apply the ketoconazole cream to affected areas once daily   Update if not starting to improve in a week or if worsening     You may need to switch out shoes if they are old

## 2017-08-19 NOTE — Progress Notes (Signed)
Subjective:    Patient ID: Max Duffy, male    DOB: 04-28-1996, 21 y.o.   MRN: [id number]  HPI Here for a rash in the groin area   Inside of R thigh  A cluster of bumps that became itchy and turned into more of a rash  Itching   Did not put anything on it   Wondered about jock itch   No suspected exp to STDs   ? If he could have mild athlete's foot  Patient Active Problem List   Diagnosis Date Noted  . Jock itch 08/19/2017  . Chronic nasal congestion 01/15/2017  . OSA (obstructive sleep apnea) 07/28/2015  . Excessive daytime sleepiness 06/19/2015  . Essential hypertension 06/19/2015  . Cognitive deficits 06/19/2015  . Somnolence, daytime 06/07/2015  . Snoring 06/07/2015  . Allergic rhinitis 06/07/2015  . Stress reaction 06/07/2015  . Constipation 04/21/2015  . Screening for HIV (human immunodeficiency virus) 04/21/2015  . Obesity 04/21/2015  . Elevated transaminase level 12/18/2014  . Sinusitis, chronic 11/15/2014  . Migraine 11/15/2014  . Routine general medical examination at a health care facility 11/15/2014   Past Medical History:  Diagnosis Date  . History of headache    Past Surgical History:  Procedure Laterality Date  . KNEE SURGERY  2010  . SINUS IRRIGATION     age 21   Social History  Substance Use Topics  . Smoking status: Former Smoker    Quit date: 11/04/2012  . Smokeless tobacco: Never Used  . Alcohol use 0.0 oz/week     Comment: occ/rare   Family History  Problem Relation Age of Onset  . Hypertension Maternal Grandfather   . Diabetes Paternal Grandfather   . Hypertension Maternal Uncle   . Diabetes Father    No Known Allergies Current Outpatient Prescriptions on File Prior to Visit  Medication Sig Dispense Refill  . cetirizine (ZYRTEC) 10 MG tablet Take 1 tablet (10 mg total) by mouth daily. 30 tablet 11  . fluticasone (FLONASE) 50 MCG/ACT nasal spray Place 2 sprays into both nostrils daily. 16 g 6  . SUMAtriptan (IMITREX) 50  MG tablet TAKE 1 TABLET BY MOUTH ONCE AS NEEDED FOR MIGRAINE. MAY REPEAT IN 2 HOURS IF HEADACHE PERSISTS 12 tablet 11   No current facility-administered medications on file prior to visit.     Review of Systems  Constitutional: Negative for activity change, appetite change, fatigue, fever and unexpected weight change.  HENT: Negative for congestion, rhinorrhea, sore throat and trouble swallowing.   Eyes: Negative for pain, redness, itching and visual disturbance.  Respiratory: Negative for cough, chest tightness, shortness of breath and wheezing.   Cardiovascular: Negative for chest pain and palpitations.  Gastrointestinal: Negative for abdominal pain, blood in stool, constipation, diarrhea and nausea.  Endocrine: Negative for cold intolerance, heat intolerance, polydipsia and polyuria.  Genitourinary: Negative for difficulty urinating, dysuria, frequency and urgency.  Musculoskeletal: Negative for arthralgias, joint swelling and myalgias.  Skin: Positive for rash. Negative for pallor.  Neurological: Negative for dizziness, tremors, weakness, numbness and headaches.  Hematological: Negative for adenopathy. Does not bruise/bleed easily.  Psychiatric/Behavioral: Negative for decreased concentration and dysphoric mood. The patient is not nervous/anxious.        Objective:   Physical Exam  Constitutional: He appears well-developed and well-nourished. No distress.  Well appearing   Eyes: Pupils are equal, round, and reactive to light. Conjunctivae and EOM are normal. Right eye exhibits no discharge. Left eye exhibits no discharge.  Neck: Normal range  of motion. Neck supple.  Cardiovascular: Normal rate and regular rhythm.   Abdominal: Soft. Bowel sounds are normal. He exhibits no distension. There is no tenderness.  No suprapubic tenderness or fullness    Musculoskeletal: He exhibits no edema or tenderness.  No joint changes   Lymphadenopathy:    He has no cervical adenopathy.  Skin:  Skin is warm and dry. Rash noted.  Rash in R>L groin area (inner thighs)  Erythematous maculopapular with satellite lesions and sharp demarcation  (resembling tinea)   Some scaling on R foot and in between 4,5th toes   Psychiatric: He has a normal mood and affect.          Assessment & Plan:   Problem List Items Addressed This Visit      Musculoskeletal and Integument   Jock itch    tx with ketoconazole cream 2%  Keep area clean and dry  Use hairdryer on cool  Continue boxer briefs tx athlete's foot as well   Update if not starting to improve in a week or if worsening

## 2017-08-20 NOTE — Assessment & Plan Note (Addendum)
tx with ketoconazole cream 2%  Keep area clean and dry  Use hairdryer on cool  Continue boxer briefs tx athlete's foot as well   Update if not starting to improve in a week or if worsening

## 2017-11-19 ENCOUNTER — Encounter: Payer: Self-pay | Admitting: Family Medicine

## 2017-11-19 ENCOUNTER — Ambulatory Visit: Payer: Managed Care, Other (non HMO) | Admitting: Family Medicine

## 2017-11-19 VITALS — BP 118/68 | HR 85 | Temp 98.7°F | Wt 218.0 lb

## 2017-11-19 DIAGNOSIS — J309 Allergic rhinitis, unspecified: Secondary | ICD-10-CM

## 2017-11-19 DIAGNOSIS — J069 Acute upper respiratory infection, unspecified: Secondary | ICD-10-CM | POA: Diagnosis not present

## 2017-11-19 MED ORDER — CETIRIZINE HCL 10 MG PO TABS: 10.0000 mg | ORAL_TABLET | Freq: Every day | ORAL | 0 refills | Status: DC

## 2017-11-19 MED ORDER — BENZONATATE 200 MG PO CAPS: 200.0000 mg | ORAL_CAPSULE | Freq: Three times a day (TID) | ORAL | 1 refills | Status: DC | PRN

## 2017-11-19 MED ORDER — FLUTICASONE PROPIONATE 50 MCG/ACT NA SUSP: 2.0000 | Freq: Every day | NASAL | 2 refills | Status: DC

## 2017-11-19 NOTE — Patient Instructions (Addendum)
Rest and fluids, use tessalon if needed for cough.  Likely a virus but likely not the flu.  Take care.  Glad to see you.  I would get a flu shot when you feel better.

## 2017-11-19 NOTE — Progress Notes (Signed)
Sx started about 5 days ago.  Cough, sputum.   Sx worse in the AM, after laying down at night.  He had some blood tinged sputum recently.   No fevers known but some hot and cold sweats.   No vomiting, no diarrhea.   He has some fatigue, esp in the last few days.   He doesn't have aches.    Working at [employer].   Congestion more than facial pain.  Some occ ear pressure.    As the day goes on he'll get a little better but is fatigued by the end of the day.    Meds, vitals, and allergies reviewed.   ROS: Per HPI unless specifically indicated in ROS section   GEN: nad, alert and oriented HEENT: mucous membranes moist, tm w/o erythema (R SOM noted but no erythema), nasal exam w/o erythema, clear discharge noted,  OP with cobblestoning, sinuses not ttp.  NECK: supple w/o LA CV: rrr.   PULM: ctab, no inc wob EXT: no edema SKIN: no acute rash

## 2017-11-20 NOTE — Assessment & Plan Note (Signed)
Likely not the flu but another benign viral process.  Even if he had the flu he has relatively mild symptoms and treatment with Tamiflu would not change his situation so testing is not needed.  Discussed with patient.  Flu vaccine encouraged for when patient gets well.  Rationale for vaccination discussed.  Supportive care in the meantime.  Rest and fluids.  Use Tessalon as needed for cough.  I sent a refill for Zyrtec and Flonase to the pharmacy in case he needed it later on.

## 2018-03-02 ENCOUNTER — Telehealth: Payer: Self-pay | Admitting: Family Medicine

## 2018-03-02 NOTE — Telephone Encounter (Signed)
Copied from CRM [id number]. Topic: Quick Communication - Rx Refill/Question >> Mar 02, 2018  2:51 PM Eston Mould B wrote: Medication: SUMAtriptan (IMITREX) 50 MG tablet   Has the patient contacted their pharmacy? Yes  (Agent: If no, request that the patient contact the pharmacy for the refill.)  Preferred Pharmacy (with phone number or street name):[facility] [phone] Judithann Sheen, [place] - [address] [phone] (Phone) [phone] (Fax)     Agent: Please be advised that RX refills may take up to 3 business days. We ask that you follow-up with your pharmacy.

## 2018-03-03 MED ORDER — SUMATRIPTAN SUCCINATE 50 MG PO TABS: ORAL_TABLET | ORAL | 3 refills | Status: DC

## 2018-03-03 NOTE — Telephone Encounter (Signed)
LOV with PCP Dr. Milinda Antis 08/19/17 Last prescription 01/15/17 Refill request Sumatriptan

## 2018-04-21 ENCOUNTER — Ambulatory Visit: Payer: Managed Care, Other (non HMO) | Admitting: Family Medicine

## 2018-04-21 ENCOUNTER — Encounter: Payer: Self-pay | Admitting: Family Medicine

## 2018-04-21 VITALS — BP 120/78 | HR 84 | Temp 98.0°F | Ht 67.75 in | Wt 226.5 lb

## 2018-04-21 DIAGNOSIS — L237 Allergic contact dermatitis due to plants, except food: Secondary | ICD-10-CM | POA: Insufficient documentation

## 2018-04-21 MED ORDER — PREDNISONE 20 MG PO TABS: ORAL_TABLET | ORAL | 0 refills | Status: DC

## 2018-04-21 MED ORDER — TRIAMCINOLONE ACETONIDE 0.1 % EX CREA: 1.0000 "application " | TOPICAL_CREAM | Freq: Two times a day (BID) | CUTANEOUS | 0 refills | Status: DC

## 2018-04-21 NOTE — Patient Instructions (Addendum)
You have poison ivy dermatitis - treat with prednisone course.  Also use topical steroids to affected areas. May use calomine lotion for itch, consider oatmeal bath, or moisturizing cream.  Use benadryl 25mg  at bedtime for itch (over the counter)  Poison Ivy Dermatitis Poison ivy dermatitis is inflammation of the skin that is caused by the allergens on the leaves of the poison ivy plant. The skin reaction often involves redness, swelling, blisters, and extreme itching. What are the causes? This condition is caused by a specific chemical (urushiol) found in the sap of the poison ivy plant. This chemical is sticky and can be easily spread to people, animals, and objects. You can get poison ivy dermatitis by:  Having direct contact with a poison ivy plant.  Touching animals, other people, or objects that have come in contact with poison ivy and have the chemical on them.  What increases the risk? This condition is more likely to develop in:  People who are outdoors often.  People who go outdoors without wearing protective clothing, such as closed shoes, long pants, and a long-sleeved shirt.  What are the signs or symptoms? Symptoms of this condition include:  Redness and itching.  A rash that often includes bumps and blisters. The rash usually appears 48 hours after exposure.  Swelling. This may occur if the reaction is more severe.  Symptoms usually last for 1-2 weeks. However, the first time you develop this condition, symptoms may last 3-4 weeks. How is this diagnosed? This condition may be diagnosed based on your symptoms and a physical exam. Your health care provider may also ask you about any recent outdoor activity. How is this treated? Treatment for this condition will vary depending on how severe it is. Treatment may include:  Hydrocortisone creams or calamine lotions to relieve itching.  Oatmeal baths to soothe the skin.  Over-the-counter antihistamine tablets.  Oral  steroid medicine for more severe outbreaks.  Follow these instructions at home:  Take or apply over-the-counter and prescription medicines only as told by your health care provider.  Wash exposed skin as soon as possible with soap and cold water.  Use hydrocortisone creams or calamine lotion as needed to soothe the skin and relieve itching.  Take oatmeal baths as needed. Use colloidal oatmeal. You can get this at your local pharmacy or grocery store. Follow the instructions on the packaging.  Do not scratch or rub your skin.  While you have the rash, wash clothes right after you wear them. How is this prevented?  Learn to identify the poison ivy plant and avoid contact with the plant. This plant can be recognized by the number of leaves. Generally, poison ivy has three leaves with flowering branches on a single stem. The leaves are typically glossy, and they have jagged edges that come to a point at the front.  If you have been exposed to poison ivy, thoroughly wash with soap and water right away. You have about 30 minutes to remove the plant resin before it will cause the rash. Be sure to wash under your fingernails because any plant resin there will continue to spread the rash.  When hiking or camping, wear clothes that will help you to avoid exposure on the skin. This includes long pants, a long-sleeved shirt, tall socks, and hiking boots. You can also apply preventive lotion to your skin to help limit exposure.  If you suspect that your clothes or outdoor gear came in contact with poison ivy, rinse them off  outside with a garden hose before you bring them inside your house. Contact a health care provider if:  You have open sores in the rash area.  You have more redness, swelling, or pain in the affected area.  You have redness that spreads beyond the rash area.  You have fluid, blood, or pus coming from the affected area.  You have a fever.  You have a rash over a large area  of your body.  You have a rash on your eyes, mouth, or genitals.  Your rash does not improve after a few days. Get help right away if:  Your face swells or your eyes swell shut.  You have trouble breathing.  You have trouble swallowing. This information is not intended to replace advice given to you by your health care provider. Make sure you discuss any questions you have with your health care provider. Document Released: 10/18/2000 Document Revised: 03/28/2016 Document Reviewed: 03/29/2015 Elsevier Interactive Patient Education  [employer]2018 [employer]

## 2018-04-21 NOTE — Assessment & Plan Note (Signed)
Story/exam consistent with poison ivy dermatitis.  Rx prednisone course, triamcinolone cream, update if not improving with treatment. Pt agrees with plan.

## 2018-04-21 NOTE — Progress Notes (Signed)
BP 120/78 (BP Location: Left Arm, Patient Position: Sitting, Cuff Size: Large)   Pulse 84   Temp 98 F (36.7 C) (Oral)   Ht 5' 7.75" (1.721 m)   Wt 226 lb 8 oz (102.7 kg)   SpO2 97%   BMI 34.69 kg/m    CC: rash Subjective:    Patient ID: Graciela Husbandsaron Blake Maeder, male    DOB: November 02, 1996, 22 y.o.   MRN: [id number]  HPI: Graciela Husbandsaron Blake Russom is a 22 y.o. male presenting on 04/21/2018 for Poison Ivy (Located allover. Was in contact with vines on 04/18/18. Rash appeared 04/19/18. Tried cortisone, no help. )   Saturday while working in the yard got into poison ivy/oak. Bilateral arms, legs, some on chest and some on face. Treating with cortisone with limited relief. Also using cool compresses to help. Rash continues to spread.   Relevant past medical, surgical, family and social history reviewed and updated as indicated. Interim medical history since our last visit reviewed. Allergies and medications reviewed and updated. Outpatient Medications Prior to Visit  Medication Sig Dispense Refill  . cetirizine (ZYRTEC) 10 MG tablet Take 1 tablet (10 mg total) by mouth at bedtime. (Patient taking differently: Take 10 mg by mouth at bedtime. As needed) 90 tablet 0  . fluticasone (FLONASE) 50 MCG/ACT nasal spray Place 2 sprays into both nostrils daily. 16 g 2  . ketoconazole (NIZORAL) 2 % cream Apply 1 application topically daily. To affected areas after cleaning and dry areas well 30 g 3  . pseudoephedrine (SUDAFED) 30 MG tablet Take 30 mg by mouth every 4 (four) hours as needed for congestion.    . SUMAtriptan (IMITREX) 50 MG tablet TAKE 1 TABLET BY MOUTH ONCE AS NEEDED FOR MIGRAINE. MAY REPEAT IN 2 HOURS IF HEADACHE PERSISTS 12 tablet 3  . benzonatate (TESSALON) 200 MG capsule Take 1 capsule (200 mg total) by mouth 3 (three) times daily as needed. 30 capsule 1   No facility-administered medications prior to visit.      Per HPI unless specifically indicated in ROS section below Review of Systems    Objective:    BP 120/78 (BP Location: Left Arm, Patient Position: Sitting, Cuff Size: Large)   Pulse 84   Temp 98 F (36.7 C) (Oral)   Ht 5' 7.75" (1.721 m)   Wt 226 lb 8 oz (102.7 kg)   SpO2 97%   BMI 34.69 kg/m   Wt Readings from Last 3 Encounters:  04/21/18 226 lb 8 oz (102.7 kg)  11/19/17 218 lb (98.9 kg)  08/19/17 219 lb 12 oz (99.7 kg)    Physical Exam  Constitutional: He appears well-developed and well-nourished. No distress.  Skin: Rash noted. There is erythema.  Pruritic papulovesicular erythematous rash throughout arms, legs, small area on face and trunk  Nursing note and vitals reviewed.     Assessment & Plan:   Problem List Items Addressed This Visit    Poison ivy dermatitis - Primary    Story/exam consistent with poison ivy dermatitis.  Rx prednisone course, triamcinolone cream, update if not improving with treatment. Pt agrees with plan.           Meds ordered this encounter  Medications  . predniSONE (DELTASONE) 20 MG tablet    Sig: Take two tablets daily for 5 days followed by one tablet daily for 5 days    Dispense:  15 tablet    Refill:  0  . triamcinolone cream (KENALOG) 0.1 %    Sig:  Apply 1 application topically 2 (two) times daily. Apply to AA.    Dispense:  45 g    Refill:  0   No orders of the defined types were placed in this encounter.   Follow up plan: Return if symptoms worsen or fail to improve.  Eustaquio Boyden, MD

## 2018-08-08 ENCOUNTER — Other Ambulatory Visit: Payer: Self-pay | Admitting: Family Medicine

## 2018-08-10 NOTE — Telephone Encounter (Signed)
Last filled on 03/03/18 #12 tabs with 3 refills, pt has had a few recent acute appts but no recent f/u or CPEs, please advise

## 2018-11-11 ENCOUNTER — Other Ambulatory Visit: Payer: Self-pay

## 2018-11-11 ENCOUNTER — Emergency Department (HOSPITAL_COMMUNITY)
Admission: EM | Admit: 2018-11-11 | Discharge: 2018-11-12 | Disposition: A | Discharge status: Home / Self Care | Payer: BLUE CROSS/BLUE SHIELD | Attending: Emergency Medicine | Admitting: Emergency Medicine

## 2018-11-11 ENCOUNTER — Encounter (HOSPITAL_COMMUNITY): Payer: Self-pay

## 2018-11-11 DIAGNOSIS — R030 Elevated blood-pressure reading, without diagnosis of hypertension: Secondary | ICD-10-CM | POA: Insufficient documentation

## 2018-11-11 DIAGNOSIS — Z79899 Other long term (current) drug therapy: Secondary | ICD-10-CM | POA: Insufficient documentation

## 2018-11-11 DIAGNOSIS — J029 Acute pharyngitis, unspecified: Secondary | ICD-10-CM | POA: Diagnosis not present

## 2018-11-11 DIAGNOSIS — Z87891 Personal history of nicotine dependence: Secondary | ICD-10-CM | POA: Insufficient documentation

## 2018-11-11 LAB — GROUP A STREP BY PCR: GROUP A STREP BY PCR: NOT DETECTED

## 2018-11-11 MED ORDER — IBUPROFEN 200 MG PO TABS
600.0000 mg | ORAL_TABLET | Freq: Once | ORAL | Status: AC
  Administered 2018-11-11: 600 mg via ORAL
  Filled 2018-11-11: qty 3

## 2018-11-11 MED ORDER — DEXAMETHASONE SODIUM PHOSPHATE 10 MG/ML IJ SOLN
10.0000 mg | Freq: Once | INTRAMUSCULAR | Status: AC
  Administered 2018-11-11: 10 mg via INTRAMUSCULAR
  Filled 2018-11-11: qty 1

## 2018-11-11 NOTE — ED Provider Notes (Signed)
[facility]-EMERGENCY DEPT Provider Note   CSN: [id number] Arrival date & time: 11/11/18  2024     History   Chief Complaint Chief Complaint  Patient presents with  . Sore Throat  . Facial Pain    HPI Max Duffy is a 23 y.o. male.  HPI  Patient is a 23 year old male with a history of allergic rhinitis presenting for sore throat, congestion, rhinorrhea.  He reports his symptoms began 4 days ago.  He reports that initially began when he was shouting in a football game and developed a sore throat, but is subsequently gotten worse.  He describes some mild maxillary and frontal pressure with his symptoms.  Denies any difficulty breathing, difficulty swallowing, neck induration or submandibular tenderness.  Does report that is painful to swallow.  He reports congestion but no purulent rhinorrhea.  Denies fever or chills.  Reports that he just began taking Mucinex and DayQuil for his symptoms. Reports he stopped taking cetirizine.   Past Medical History:  Diagnosis Date  . History of headache     Patient Active Problem List   Diagnosis Date Noted  . Poison ivy dermatitis 04/21/2018  . Jock itch 08/19/2017  . Chronic nasal congestion 01/15/2017  . OSA (obstructive sleep apnea) 07/28/2015  . Excessive daytime sleepiness 06/19/2015  . Essential hypertension 06/19/2015  . Cognitive deficits 06/19/2015  . Somnolence, daytime 06/07/2015  . Snoring 06/07/2015  . Allergic rhinitis 06/07/2015  . Stress reaction 06/07/2015  . Constipation 04/21/2015  . Screening for HIV (human immunodeficiency virus) 04/21/2015  . Obesity 04/21/2015  . Elevated transaminase level 12/18/2014  . Sinusitis, chronic 11/15/2014  . Migraine 11/15/2014  . Routine general medical examination at a health care facility 11/15/2014    Past Surgical History:  Procedure Laterality Date  . KNEE SURGERY  2010  . SINUS IRRIGATION     age 59        Home Medications    Prior to  Admission medications   Medication Sig Start Date End Date Taking? Authorizing Provider  cetirizine (ZYRTEC) 10 MG tablet Take 1 tablet (10 mg total) by mouth at bedtime. Patient taking differently: Take 10 mg by mouth at bedtime. As needed 11/19/17   Joaquim Nam, MD  fluticasone [facility]) 50 MCG/ACT nasal spray Place 2 sprays into both nostrils daily. 11/19/17   Joaquim Nam, MD  ketoconazole (NIZORAL) 2 % cream Apply 1 application topically daily. To affected areas after cleaning and dry areas well 08/19/17   Tower, Audrie Gallus, MD  predniSONE (DELTASONE) 20 MG tablet Take two tablets daily for 5 days followed by one tablet daily for 5 days 04/21/18   Eustaquio Boyden, MD  pseudoephedrine (SUDAFED) 30 MG tablet Take 30 mg by mouth every 4 (four) hours as needed for congestion.    [provider]  SUMAtriptan (IMITREX) 50 MG tablet TAKE 1 TABLET BY MOUTH ONCE AS NEEDED FOR MIGRAINE. MAY REPEAT IN 2 HOURS IF HEADACHE PERSISTS 08/10/18   Tower, Carrollton A, MD  triamcinolone cream (KENALOG) 0.1 % Apply 1 application topically 2 (two) times daily. Apply to AA. 04/21/18 04/21/19  Eustaquio Boyden, MD    Family History Family History  Problem Relation Age of Onset  . Hypertension Maternal Grandfather   . Diabetes Paternal Grandfather   . Hypertension Maternal Uncle   . Diabetes Father     Social History Social History   Tobacco Use  . Smoking status: Former Smoker    Last attempt to  quit: 11/04/2012    Years since quitting: 6.0  . Smokeless tobacco: Never Used  Substance Use Topics  . Alcohol use: Yes    Alcohol/week: 0.0 standard drinks    Comment: occ/rare  . Drug use: No     Allergies   Patient has no known allergies.   Review of Systems Review of Systems  Constitutional: Negative for chills and fever.  HENT: Positive for congestion, rhinorrhea, sinus pressure and sore throat. Negative for sinus pain, trouble swallowing and voice change.   Respiratory: Negative for  shortness of breath and stridor.   Gastrointestinal: Negative for nausea and vomiting.     Physical Exam Updated Vital Signs BP (!) 154/92 (BP Location: Left Arm)   Pulse (!) 105   Temp 98.8 F (37.1 C) (Oral)   Resp 20   Ht 5\' 7"  (1.702 m)   Wt 104.8 kg   SpO2 100%   BMI 36.20 kg/m   Physical Exam Vitals signs and nursing note reviewed.  Constitutional:      General: He is not in acute distress.    Appearance: He is well-developed. He is not diaphoretic.     Comments: Sitting comfortably in bed.  HENT:     Head: Normocephalic and atraumatic.     Comments: Normal phonation. No muffled voice sounds. Patient swallows secretions without difficulty. Dentition normal. No lesions of tongue or buccal mucosa. Uvula midline. No asymmetric swelling of the posterior pharynx. Erythema of posterior pharynx. No tonsillar exuduate. No lingual swelling. No induration inferior to tongue. No submandibular tenderness, swelling, or induration.  Tissues of the neck supple. No cervical lymphadenopathy. Right TM without erythema or effusion; left TM without erythema or effusion.    Mouth/Throat:     Tonsils: Swelling: 2+ on the right. 2+ on the left.  Eyes:     General:        Right eye: No discharge.        Left eye: No discharge.     Conjunctiva/sclera: Conjunctivae normal.     Comments: EOMs normal to gross examination.  Neck:     Musculoskeletal: Normal range of motion.  Cardiovascular:     Rate and Rhythm: Normal rate and regular rhythm.     Heart sounds: Normal heart sounds.     Comments: Intact, 2+ radial pulse. Pulmonary:     Effort: Pulmonary effort is normal.     Breath sounds: No wheezing, rhonchi or rales.  Abdominal:     General: There is no distension.  Musculoskeletal: Normal range of motion.  Skin:    General: Skin is warm and dry.  Neurological:     Mental Status: He is alert.     Comments: Cranial nerves intact to gross observation. Patient moves extremities without  difficulty.  Psychiatric:        Behavior: Behavior normal.        Thought Content: Thought content normal.        Judgment: Judgment normal.      ED Treatments / Results  Labs (all labs ordered are listed, but only abnormal results are displayed) Labs Reviewed  GROUP A STREP BY PCR    EKG None  Radiology No results found.  Procedures Procedures (including critical care time)  Medications Ordered in ED Medications  dexamethasone (DECADRON) injection 10 mg (has no administration in time range)  ibuprofen (ADVIL,MOTRIN) tablet 600 mg (600 mg Oral Given 11/11/18 2229)     Initial Impression / Assessment and Plan / ED Course  I have reviewed the triage vital signs and the nursing notes.  Pertinent labs & imaging results that were available during my care of the patient were reviewed by me and considered in my medical decision making (see chart for details).      Max Duffy is a 23 y.o. male who presents to ED for sore throat and congestion. Patient nontoxic appearing and in no acute distress. Rapid strep negative. Suspect viral etiology. Do not suspect acute sinusitis. Presentation not concerning for PTA or infection spread to soft tissue. No trismus or uvula deviation. Patient with normal phonation. Exam demonstrates soft neck tissue, no swelling or induration inferior to the tongue or in the submandibular space  Treated in the ED with steroids, NSAIDs. Recommended ibuprofen, Mucinex DM, and Cepacol. Patient able to drink water in ED without difficulty with intact air way. Specific return precautions discussed for change in voice, inability to tolerate secretions, difficulty breathing or swallowing, or increased nausea or vomiting. Recommended reassessment for increasing facial pain or fevers suggestive of acute bacterial sinusitis. Discussed importance of hydration. Recommended PCP follow up. All questions answered.  Final Clinical Impressions(s) / ED Diagnoses   Final  diagnoses:  Viral pharyngitis  Elevated blood pressure reading without diagnosis of hypertension    ED Discharge Orders    None       Delia Chimes 11/11/18 2356    Mancel Bale, MD 11/14/18 1718

## 2018-11-11 NOTE — Discharge Instructions (Signed)
Please read and follow all provided instructions.  Your diagnoses today include:  1. Viral pharyngitis   2. Elevated blood pressure reading without diagnosis of hypertension     You appear to have an upper respiratory infection (URI). An upper respiratory tract infection, or cold, is a viral infection of the air passages leading to the lungs. It should improve gradually after 5-7 days. You may have a lingering cough that lasts for 2- 4 weeks after the infection.  Tests performed today include: Vital signs. See below for your results today.   Medications prescribed:   Take any prescribed medications only as directed. Treatment for your infection is aimed at treating the symptoms. There are no medications, such as antibiotics, that will cure your infection.   Please continue to take Mucinex twice a day.  You may take over the counter Cepacol for your throat. PLEASE REFER TO PACKAGE DOSING SO YOU DO NOT TAKE TOO MUCH.   I recommend ibuprofen, a non-steroidal anti-inflammatory agent (NSAID) for pain. You may take 600 mg every 6 hours as needed for pain. If still requiring this medication around the clock for acute pain after 10 days, please see your primary healthcare provider.  Women who are pregnant, breastfeeding, or planning on becoming pregnant should not take non-steroidal anti-inflammatories such as Advil and Aleve. Tylenol is a safe over the counter pain reliever in pregnant women.  You may combine this medication with Tylenol, 650 mg every 6 hours, so you are receiving something for pain every 3 hours.  This is not a long-term medication unless under the care and direction of your primary provider. Taking this medication long-term and not under the supervision of a healthcare provider could increase the risk of stomach ulcers, kidney problems, and cardiovascular problems such as high blood pressure.    Home care instructions:  Follow any educational materials contained in this  packet.   Your illness is contagious and can be spread to others, especially during the first 3 or 4 days. It cannot be cured by antibiotics or other medicines. Take basic precautions such as washing your hands often, covering your mouth when you cough or sneeze, and avoiding public places where you could spread your illness to others.   Please continue drinking plenty of fluids.  Use over-the-counter medicines as needed as directed on packaging for symptom relief.  You may also use ibuprofen or tylenol as directed on packaging for pain or fever.  Do not take multiple medicines containing Tylenol or acetaminophen to avoid taking too much of this medication.  Follow-up instructions: Please follow-up with your primary care provider in the next 3 days for further evaluation of your symptoms if you are not feeling better.   Return instructions:  Please return to the Emergency Department if you experience worsening symptoms.  RETURN IMMEDIATELY IF you develop shortness of breath, chest pain, confusion or altered mental status, a new rash, become dizzy, faint, or poorly responsive, or are unable to be cared for at home. Please return if you have persistent vomiting and cannot keep down fluids or develop a fever that is not controlled by tylenol or motrin.   Please return if you have any other emergent concerns.  Additional Information:  Your vital signs today were: BP (!) 143/84 (BP Location: Left Arm)    Pulse 97    Temp 98.8 F (37.1 C) (Oral)    Resp 20    Ht 5\' 7"  (1.702 m)    Wt 104.8 kg  SpO2 96%    BMI 36.20 kg/m  If your blood pressure (BP) was elevated above 135/85 this visit, please have this repeated by your doctor within one month. --------------

## 2018-11-11 NOTE — ED Triage Notes (Signed)
Pt reports sore throat and sinus pressure for a few days. Pt reports feeling like he is going to throw up because of sore throat. Pt states that he had a sinus surgery when he was 2 and has always had sinus problems since.

## 2018-11-16 ENCOUNTER — Ambulatory Visit (INDEPENDENT_AMBULATORY_CARE_PROVIDER_SITE_OTHER): Payer: BLUE CROSS/BLUE SHIELD | Admitting: Internal Medicine

## 2018-11-16 ENCOUNTER — Encounter: Payer: Self-pay | Admitting: Internal Medicine

## 2018-11-16 VITALS — BP 148/84 | HR 82 | Resp 16 | Ht 67.0 in | Wt 231.0 lb

## 2018-11-16 DIAGNOSIS — G4719 Other hypersomnia: Secondary | ICD-10-CM | POA: Diagnosis not present

## 2018-11-16 DIAGNOSIS — G4733 Obstructive sleep apnea (adult) (pediatric): Secondary | ICD-10-CM

## 2018-11-16 NOTE — Progress Notes (Signed)
[facility]  Assessment and Plan:  Obstructive sleep apnea with excessive daytime sleepiness. -He has stopped using CPAP and is more sleepy again during the day. -Per insurance requirements we will need to send him for a new sleep study in order to requalify for CPAP.  He is lost his supplies and power supply for his current CPAP device.  Obesity. --Weight loss may be beneficial for snoring.   Migraine. --Sleep apnea can contribute to headaches.   Orders Placed This Encounter  Procedures  . Home sleep test   Return in about 3 months (around 02/15/2019).   Date: 11/16/2018  MRN# [id number] Max Duffy 1995-12-09   Max Duffy is a 23 y.o. old male seen in follow up for chief complaint of  Sleep apnea    HPI:  The patient last in 11/2015 and was started on PAP for OSA. He was having some issues with his mask, he was doing ok for a while, but eventually stopped using it in around 02/2016. He stopped using it he thinks because he did not like the mask.  Since not using it he has become more sleepy during the day. He tends to fall asleep during long car rides. 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 They have requested a new sleep study, apparently. He got his supplies through [facility], but has gone back to them in about 2 years.    Review in summary overall data from titration study: The patient wore the CPAP for a total of 10 out of 30 days, maximum average pressure was 8.6, the patient's apnea , hypopnea index remained elevated between 5 and 10 patient can be started on a CPAP level of 8, though the data is suboptimal for this level.  Review of download CPAP data on CPAP @ 8 shows residual AHI of 1.6. However usage was suboptimal  with usage of only 5 days thus far.  Medication:   Outpatient Encounter Medications as of 11/16/2018  Medication Sig  . cetirizine (ZYRTEC) 10 MG tablet  Take 1 tablet (10 mg total) by mouth at bedtime. (Patient taking differently: Take 10 mg by mouth at bedtime. As needed)  . fluticasone (FLONASE) 50 MCG/ACT nasal spray Place 2 sprays into both nostrils daily.  Marland Kitchen ketoconazole (NIZORAL) 2 % cream Apply 1 application topically daily. To affected areas after cleaning and dry areas well  . predniSONE (DELTASONE) 20 MG tablet Take two tablets daily for 5 days followed by one tablet daily for 5 days  . pseudoephedrine (SUDAFED) 30 MG tablet Take 30 mg by mouth every 4 (four) hours as needed for congestion.  . SUMAtriptan (IMITREX) 50 MG tablet TAKE 1 TABLET BY MOUTH ONCE AS NEEDED FOR MIGRAINE. MAY REPEAT IN 2 HOURS IF HEADACHE PERSISTS  . triamcinolone cream (KENALOG) 0.1 % Apply 1 application topically 2 (two) times daily. Apply to AA.   No facility-administered encounter medications on file as of 11/16/2018.      Allergies:  Patient has no known allergies.  Review of Systems:  Constitutional: Feels well. Cardiovascular: Denies chest pain, exertional chest pain.  Pulmonary: Denies hemoptysis, pleuritic chest pain.   The remainder of systems were reviewed and were found to be negative other than what is documented in the HPI.    Physical Examination:   VS: BP (!) 148/84 (BP Location: Left Arm, Cuff Size: Large)   Pulse 82  Resp 16   Ht 5\' 7"  (1.702 m)   Wt 231 lb (104.8 kg)   SpO2 100%   BMI 36.18 kg/m   General Appearance: No distress  Neuro:without focal findings, mental status, speech normal, alert and oriented HEENT: PERRLA, EOM intact Pulmonary: No wheezing, No rales  CardiovascularNormal S1,S2.  No m/r/g.  Abdomen: Benign, Soft, non-tender, No masses Renal:  No costovertebral tenderness  GU:  No performed at this time. Endoc: No evident thyromegaly, no signs of acromegaly or Cushing features Skin:   warm, no rashes, no ecchymosis  Extremities: normal, no cyanosis, clubbing.     LABORATORY PANEL:   CBC No results for  input(s): WBC, HGB, HCT, PLT in the last 168 hours. ------------------------------------------------------------------------------------------------------------------  Chemistries  No results for input(s): NA, K, CL, CO2, GLUCOSE, BUN, CREATININE, CALCIUM, MG, AST, ALT, ALKPHOS, BILITOT in the last 168 hours.  Invalid input(s): GFRCGP ------------------------------------------------------------------------------------------------------------------  Cardiac Enzymes No results for input(s): TROPONINI in the last 168 hours. ------------------------------------------------------------  RADIOLOGY:   No results found for this or any previous visit. No results found for this or any previous visit. ------------------------------------------------------------------------------------------------------------------  Thank  you for allowing [facility] to assist in the care of your patient. Our recommendations are noted above.  Please contact us if we can be of further service.  Wells Guileseep Demyan Fugate, M.D., F.C.C.P.  Board Certified in Internal Medicine, Pulmonary Medicine, Critical Care Medicine, and Sleep Medicine.  [facility] Office Number: [phone]

## 2018-11-16 NOTE — Patient Instructions (Addendum)
Will send for a new home sleep test.  Do not drive while sleepy.    Sleep Apnea    Sleep apnea is disorder that affects a person's sleep. A person with sleep apnea has abnormal pauses in their breathing when they sleep. It is hard for them to get a good sleep. This makes a person tired during the day. It also can lead to other physical problems. There are three types of sleep apnea. One type is when breathing stops for a short time because your airway is blocked (obstructive sleep apnea). Another type is when the brain sometimes fails to give the normal signal to breathe to the muscles that control your breathing (central sleep apnea). The third type is a combination of the other two types.  HOME CARE   Take all medicine as told by your doctor.  Avoid alcohol, calming medicines (sedatives), and depressant drugs.  Try to lose weight if you are overweight. Talk to your doctor about a healthy weight goal.  Your doctor may have you use a device that helps to open your airway. It can help you get the air that you need. It is called a positive airway pressure (PAP) device.   MAKE SURE YOU:   Understand these instructions.  Will watch your condition.  Will get help right away if you are not doing well or get worse.  It may take approximately 1 month for you to get used to wearing her CPAP every night.  Be sure to work with your machine to get used to it, be patient, it may take time!  If you have trouble tolerating CPAP DO NOT RETURN YOUR MACHINE; Contact our office to see if we can help you tolerate the CPAP better first!

## 2018-11-24 ENCOUNTER — Encounter: Payer: Self-pay | Admitting: Family Medicine

## 2018-11-24 ENCOUNTER — Ambulatory Visit (INDEPENDENT_AMBULATORY_CARE_PROVIDER_SITE_OTHER): Payer: BLUE CROSS/BLUE SHIELD | Admitting: Family Medicine

## 2018-11-24 ENCOUNTER — Other Ambulatory Visit: Payer: Self-pay | Admitting: Family Medicine

## 2018-11-24 VITALS — BP 126/76 | HR 73 | Temp 98.1°F | Ht 67.75 in | Wt 232.8 lb

## 2018-11-24 DIAGNOSIS — J069 Acute upper respiratory infection, unspecified: Secondary | ICD-10-CM | POA: Insufficient documentation

## 2018-11-24 DIAGNOSIS — G4733 Obstructive sleep apnea (adult) (pediatric): Secondary | ICD-10-CM

## 2018-11-24 MED ORDER — AZITHROMYCIN 250 MG PO TABS: ORAL_TABLET | ORAL | 0 refills | Status: DC

## 2018-11-24 MED ORDER — GUAIFENESIN-CODEINE 100-10 MG/5ML PO SYRP: 5.0000 mL | ORAL_SOLUTION | Freq: Every evening | ORAL | 0 refills | Status: DC | PRN

## 2018-11-24 MED ORDER — PREDNISONE 10 MG PO TABS: ORAL_TABLET | ORAL | 0 refills | Status: DC

## 2018-11-24 NOTE — Patient Instructions (Signed)
Drink fluids Rest when you can   Take zithromax  Also prednisone taper   Try the robitussin with codeine at night  During the day - over the counter cough medicine   Update if not starting to improve in a week or if worsening

## 2018-11-24 NOTE — Telephone Encounter (Signed)
Pt was seen today for an acute illness, please advise

## 2018-11-24 NOTE — Progress Notes (Signed)
Subjective:    Patient ID: Max Duffy, male    DOB: 12/28/1995, 23 y.o.   MRN: [id number]  HPI  Here for ST with uri symptoms (started early jan) Throat got swollen after yelling at a football game  Got worse  He was seen in ED on 1/8 and strep test was neg diag with viral pharyngitis  tx with decadron and nsaid  Told to take mucinex and analgesics   He started to get better Swelling went down   ST persists- getting worse again   Cough - is prod of green mucous   No fever   Not a lot of runny nose  L nostril hurts  Some sinus pressure under eyes- for over 2 weeks R ear feels full   Wants to get better so he can get back to the gym and work on wt loss    Patient Active Problem List   Diagnosis Date Noted  . URI with cough and congestion 11/24/2018  . Poison ivy dermatitis 04/21/2018  . Jock itch 08/19/2017  . Chronic nasal congestion 01/15/2017  . OSA (obstructive sleep apnea) 07/28/2015  . Excessive daytime sleepiness 06/19/2015  . Essential hypertension 06/19/2015  . Cognitive deficits 06/19/2015  . Somnolence, daytime 06/07/2015  . Snoring 06/07/2015  . Allergic rhinitis 06/07/2015  . Stress reaction 06/07/2015  . Constipation 04/21/2015  . Screening for HIV (human immunodeficiency virus) 04/21/2015  . Obesity 04/21/2015  . Elevated transaminase level 12/18/2014  . Sinusitis, chronic 11/15/2014  . Migraine 11/15/2014  . Routine general medical examination at a health care facility 11/15/2014   Past Medical History:  Diagnosis Date  . History of headache    Past Surgical History:  Procedure Laterality Date  . KNEE SURGERY  2010  . SINUS IRRIGATION     age 34   Social History   Tobacco Use  . Smoking status: Former Smoker    Last attempt to quit: 11/04/2012    Years since quitting: 6.0  . Smokeless tobacco: Never Used  Substance Use Topics  . Alcohol use: Yes    Alcohol/week: 0.0 standard drinks    Comment: occ/rare  . Drug use: No    Family History  Problem Relation Age of Onset  . Hypertension Maternal Grandfather   . Diabetes Paternal Grandfather   . Hypertension Maternal Uncle   . Diabetes Father    No Known Allergies Current Outpatient Medications on File Prior to Visit  Medication Sig Dispense Refill  . cetirizine (ZYRTEC) 10 MG tablet Take 1 tablet (10 mg total) by mouth at bedtime. (Patient taking differently: Take 10 mg by mouth at bedtime. As needed) 90 tablet 0  . ketoconazole (NIZORAL) 2 % cream Apply 1 application topically daily. To affected areas after cleaning and dry areas well 30 g 3  . SUMAtriptan (IMITREX) 50 MG tablet TAKE 1 TABLET BY MOUTH ONCE AS NEEDED FOR MIGRAINE. MAY REPEAT IN 2 HOURS IF HEADACHE PERSISTS 9 tablet 3   No current facility-administered medications on file prior to visit.       Review of Systems  Constitutional: Positive for appetite change and fatigue. Negative for chills and fever.  HENT: Positive for congestion, ear pain, postnasal drip, rhinorrhea, sinus pressure, sneezing, sore throat and voice change. Negative for ear discharge, facial swelling and sinus pain.   Eyes: Negative for pain and discharge.  Respiratory: Positive for cough. Negative for chest tightness, shortness of breath, wheezing and stridor.   Cardiovascular: Negative for chest pain.  Gastrointestinal: Negative for diarrhea, nausea and vomiting.  Genitourinary: Negative for frequency, hematuria and urgency.  Musculoskeletal: Negative for arthralgias and myalgias.  Skin: Negative for rash.  Neurological: Positive for headaches. Negative for dizziness, weakness and light-headedness.  Psychiatric/Behavioral: Negative for confusion and dysphoric mood.       Objective:   Physical Exam Constitutional:      General: He is not in acute distress.    Appearance: Normal appearance. He is well-developed. He is obese. He is not ill-appearing, toxic-appearing or diaphoretic.  HENT:     Head: Normocephalic  and atraumatic.     Comments: Nares are injected and congested      Right Ear: Tympanic membrane, ear canal and external ear normal.     Left Ear: Tympanic membrane, ear canal and external ear normal.     Nose: Congestion and rhinorrhea present.     Mouth/Throat:     Mouth: Mucous membranes are moist. No oral lesions.     Pharynx: Oropharynx is clear. Posterior oropharyngeal erythema present. No pharyngeal swelling, oropharyngeal exudate or uvula swelling.     Comments: Clear pnd   Mild posterior throat injection  No swelling or exudate  Eyes:     General:        Right eye: No discharge.        Left eye: No discharge.     Conjunctiva/sclera: Conjunctivae normal.     Pupils: Pupils are equal, round, and reactive to light.  Neck:     Musculoskeletal: Normal range of motion and neck supple.  Cardiovascular:     Rate and Rhythm: Normal rate.     Heart sounds: Normal heart sounds.  Pulmonary:     Effort: Pulmonary effort is normal. No respiratory distress.     Breath sounds: Normal breath sounds. No stridor. No wheezing, rhonchi or rales.     Comments: Good air exch No rales/rhonchi  Harsh bs w/o wheeze  Barky cough Chest:     Chest wall: No tenderness.  Lymphadenopathy:     Cervical: No cervical adenopathy.  Skin:    General: Skin is warm and dry.     Capillary Refill: Capillary refill takes less than 2 seconds.     Findings: No rash.  Neurological:     Mental Status: He is alert.     Cranial Nerves: No cranial nerve deficit.  Psychiatric:        Mood and Affect: Mood normal.           Assessment & Plan:   Problem List Items Addressed This Visit      Respiratory   URI with cough and congestion - Primary    Ongoing over 3 weeks Started with ST- tx with decadron in ED Improved then worsened  Some sinus pressure- poss early sinus infection (with h/o sinus surgery in the past) Cover with zithromax Also prednisone taper (disc side eff)  Fluids/rest/sympt care   Robitussin ac for pm cough, caution of sedation and habit  Update if not starting to improve in a week or if worsening        Relevant Medications   azithromycin (ZITHROMAX Z-PAK) 250 MG tablet

## 2018-11-24 NOTE — Assessment & Plan Note (Signed)
Ongoing over 3 weeks Started with ST- tx with decadron in ED Improved then worsened  Some sinus pressure- poss early sinus infection (with h/o sinus surgery in the past) Cover with zithromax Also prednisone taper (disc side eff)  Fluids/rest/sympt care  Robitussin ac for pm cough, caution of sedation and habit  Update if not starting to improve in a week or if worsening

## 2018-11-25 ENCOUNTER — Ambulatory Visit: Payer: Managed Care, Other (non HMO) | Admitting: Family Medicine

## 2018-11-27 ENCOUNTER — Telehealth: Payer: Self-pay | Admitting: *Deleted

## 2018-11-27 DIAGNOSIS — G4733 Obstructive sleep apnea (adult) (pediatric): Secondary | ICD-10-CM

## 2018-11-27 NOTE — Telephone Encounter (Signed)
Pt aware Orders placed for cpap therapy.

## 2018-12-01 ENCOUNTER — Ambulatory Visit: Payer: BLUE CROSS/BLUE SHIELD | Admitting: Family Medicine

## 2018-12-02 ENCOUNTER — Telehealth: Payer: Self-pay | Admitting: Family Medicine

## 2018-12-02 NOTE — Telephone Encounter (Signed)
Yes, I will change status to ensure pt is not charged the no show fee.

## 2018-12-02 NOTE — Telephone Encounter (Signed)
Pt came into office thought appt was scheduled for 12/02/18 but his appt was actually on Tues 12/01/18 @ 4pm. Pt wanted Dr. Milinda Antis to know that he is feeling better. Medicine seems to be working wants to know if you still need to follow up or not? He also wants to know if he will be charged No Show fee since he got dates confused? Please advise.

## 2018-12-02 NOTE — Telephone Encounter (Signed)
No need to f/u if feeling better  Can waive to no show fee if it is not too late

## 2018-12-07 ENCOUNTER — Telehealth: Payer: Self-pay | Admitting: Internal Medicine

## 2018-12-07 NOTE — Telephone Encounter (Signed)
Spoke with Max Duffy and she wanted me to fax the sleep report to [phone]. Faxed sleep report and received confirmation that the fax was successful. Nothing further is needed.

## 2018-12-08 ENCOUNTER — Other Ambulatory Visit: Payer: Self-pay

## 2018-12-08 DIAGNOSIS — G4719 Other hypersomnia: Secondary | ICD-10-CM

## 2018-12-08 DIAGNOSIS — G4733 Obstructive sleep apnea (adult) (pediatric): Secondary | ICD-10-CM

## 2018-12-17 ENCOUNTER — Other Ambulatory Visit: Payer: Self-pay | Admitting: Family Medicine

## 2018-12-17 NOTE — Telephone Encounter (Signed)
Pt's had multiple recent acute appts but no recent f/u or CPE, please advise

## 2018-12-22 ENCOUNTER — Telehealth: Payer: Self-pay | Admitting: Internal Medicine

## 2018-12-22 DIAGNOSIS — G4733 Obstructive sleep apnea (adult) (pediatric): Secondary | ICD-10-CM

## 2018-12-22 NOTE — Telephone Encounter (Signed)
Called [facility] spoke with [place]. She says patient must wear 21/30 days at least 4 hrs. She says patient his power cord was not working.  He went to [facility] and they gave him a another power cord but he does not have supplies (mask). He is not able to start therapy.  A new order was placed 11/27/18 for new cpap machine. It was going to cost him $471.55 for set-up. Monthly rental would $101.00  Will offer mask fit. Pt would like to go to mask fit Referral entered. Nothing further needed.

## 2018-12-24 ENCOUNTER — Encounter: Payer: Self-pay | Admitting: Internal Medicine

## 2018-12-24 ENCOUNTER — Ambulatory Visit (INDEPENDENT_AMBULATORY_CARE_PROVIDER_SITE_OTHER): Payer: BLUE CROSS/BLUE SHIELD | Admitting: Internal Medicine

## 2018-12-24 VITALS — BP 128/82 | HR 80 | Temp 98.1°F | Wt 233.0 lb

## 2018-12-24 DIAGNOSIS — R21 Rash and other nonspecific skin eruption: Secondary | ICD-10-CM | POA: Diagnosis not present

## 2018-12-24 DIAGNOSIS — G43C1 Periodic headache syndromes in child or adult, intractable: Secondary | ICD-10-CM

## 2018-12-24 DIAGNOSIS — R112 Nausea with vomiting, unspecified: Secondary | ICD-10-CM | POA: Diagnosis not present

## 2018-12-24 MED ORDER — METHYLPREDNISOLONE ACETATE 80 MG/ML IJ SUSP
80.0000 mg | Freq: Once | INTRAMUSCULAR | Status: AC
  Administered 2018-12-24: 80 mg via INTRAMUSCULAR

## 2018-12-24 MED ORDER — ONDANSETRON HCL 4 MG PO TABS: 4.0000 mg | ORAL_TABLET | Freq: Three times a day (TID) | ORAL | 0 refills | Status: DC | PRN

## 2018-12-24 NOTE — Addendum Note (Signed)
Addended by: Roena Malady on: 12/24/2018 03:58 PM   Modules accepted: Orders

## 2018-12-24 NOTE — Patient Instructions (Signed)

## 2018-12-24 NOTE — Progress Notes (Signed)
Subjective:    Patient ID: Max Duffy, male    DOB: 05-25-1996, 23 y.o.   MRN: [id number]  HPI  Pt presents to the clinic today with c/o a rash on his face. He reports he had a migraine last night, which is not atypical for him. He started vomiting. After vomiting, he noticed his face was extremely red. He felt hot and flushed. He has not noticed a rash in the area. He denies itching or burning of his face. He took Imitrex with some relief of the migraine. He would like to know if he can have something prn for nausea secondary to his migraines. He did not try anything OTC for his rash.  Review of Systems      Past Medical History:  Diagnosis Date  . History of headache     Current Outpatient Medications  Medication Sig Dispense Refill  . azithromycin (ZITHROMAX Z-PAK) 250 MG tablet Take 2 pills by mouth today and then 1 pill daily for 4 days 6 tablet 0  . cetirizine (ZYRTEC) 10 MG tablet Take 1 tablet (10 mg total) by mouth at bedtime. (Patient taking differently: Take 10 mg by mouth at bedtime. As needed) 90 tablet 0  . guaiFENesin-codeine (ROBITUSSIN AC) 100-10 MG/5ML syrup Take 5-10 mLs by mouth at bedtime as needed for cough. 100 mL 0  . ketoconazole (NIZORAL) 2 % cream APPLY 1 APPLICATION TOPICALLY DAILY. TO AFFECTED AREAS AFTER CLEANING AND DRY AREAS WELL 30 g 1  . predniSONE (DELTASONE) 10 MG tablet Take 3 pills once daily by mouth for 3 days, then 2 pills once daily for 3 days, then 1 pill once daily for 3 days and then stop 18 tablet 0  . SUMAtriptan (IMITREX) 50 MG tablet TAKE 1 TABLET BY MOUTH ONCE AS NEEDED FOR MIGRAINE. MAY REPEAT IN 2 HOURS IF HEADACHE PERSISTS 9 tablet 3   No current facility-administered medications for this visit.     No Known Allergies  Family History  Problem Relation Age of Onset  . Hypertension Maternal Grandfather   . Diabetes Paternal Grandfather   . Hypertension Maternal Uncle   . Diabetes Father     Social History    Socioeconomic History  . Marital status: Married    Spouse name: Not on file  . Number of children: Not on file  . Years of education: Not on file  . Highest education level: Not on file  Occupational History  . Not on file  Social Needs  . Financial resource strain: Not on file  . Food insecurity:    Worry: Not on file    Inability: Not on file  . Transportation needs:    Medical: Not on file    Non-medical: Not on file  Tobacco Use  . Smoking status: Former Smoker    Last attempt to quit: 11/04/2012    Years since quitting: 6.1  . Smokeless tobacco: Never Used  Substance and Sexual Activity  . Alcohol use: Yes    Alcohol/week: 0.0 standard drinks    Comment: occ/rare  . Drug use: No  . Sexual activity: Yes  Lifestyle  . Physical activity:    Days per week: Not on file    Minutes per session: Not on file  . Stress: Not on file  Relationships  . Social connections:    Talks on phone: Not on file    Gets together: Not on file    Attends religious service: Not on file  Active member of club or organization: Not on file    Attends meetings of clubs or organizations: Not on file    Relationship status: Not on file  . Intimate partner violence:    Fear of current or ex partner: Not on file    Emotionally abused: Not on file    Physically abused: Not on file    Forced sexual activity: Not on file  Other Topics Concern  . Not on file  Social History Narrative  . Not on file     Constitutional: Pt reports intermittent migraines. Denies fever, malaise, fatigue, or abrupt weight changes.  HEENT: Denies eye pain, eye redness, ear pain, ringing in the ears, wax buildup, runny nose, nasal congestion, bloody nose, or sore throat. Respiratory: Denies difficulty breathing, shortness of breath, cough or sputum production.   Cardiovascular: Denies chest pain, chest tightness, palpitations or swelling in the hands or feet.  Gastrointestinal: Pt reports nausea. Denies  abdominal pain, bloating, constipation, diarrhea or blood in the stool.  Neurological: Denies dizziness, difficulty with memory, difficulty with speech or problems with balance and coordination.    No other specific complaints in a complete review of systems (except as listed in HPI above).  Objective:   Physical Exam  BP 128/82   Pulse 80   Temp 98.1 F (36.7 C) (Oral)   Wt 233 lb (105.7 kg)   SpO2 98%   BMI 35.69 kg/m  Wt Readings from Last 3 Encounters:  12/24/18 233 lb (105.7 kg)  11/24/18 232 lb 12 oz (105.6 kg)  11/16/18 231 lb (104.8 kg)    General: Appears his stated age, well developed, well nourished in NAD. Skin: Warm, dry and intact. Face flushed but no appreciable rash. HEENT: Head: normal shape and size; Eyes: sclera white, no icterus, conjunctiva pink, PERRLA and EOMs intact;  Cardiovascular: Normal rate and rhythm. S1,S2 noted.  No murmur, rubs or gallops noted.  Pulmonary/Chest: Normal effort and positive vesicular breath sounds. No respiratory distress. No wheezes, rales or ronchi noted.  Neurological: Alert and oriented. Coordination normal.    BMET    Component Value Date/Time   NA 141 01/15/2017 1803   NA 138 01/19/2016 1548   K 4.2 01/15/2017 1803   CL 106 01/15/2017 1803   CO2 28 01/15/2017 1803   GLUCOSE 81 01/15/2017 1803   BUN 14 01/15/2017 1803   BUN 10 01/19/2016 1548   CREATININE 1.07 01/15/2017 1803   CALCIUM 10.1 01/15/2017 1803   GFRNONAA 115 01/19/2016 1548   GFRAA 133 01/19/2016 1548    Lipid Panel     Component Value Date/Time   CHOL 136 01/19/2016 1548   TRIG 130 01/19/2016 1548   HDL 38 (L) 01/19/2016 1548   CHOLHDL 3.6 01/19/2016 1548   LDLCALC 72 01/19/2016 1548    CBC    Component Value Date/Time   WBC 8.9 01/19/2016 1548   RBC 5.27 01/19/2016 1548   HGB 14.7 01/19/2016 1548   HCT 43.0 01/19/2016 1548   PLT 307 01/19/2016 1548   MCV 82 01/19/2016 1548   MCH 27.9 01/19/2016 1548   MCHC 34.2 01/19/2016 1548    RDW 13.2 01/19/2016 1548   LYMPHSABS 3.1 01/19/2016 1548   EOSABS 0.2 01/19/2016 1548   BASOSABS 0.1 01/19/2016 1548    Hgb A1C No results found for: HGBA1C          Assessment & Plan:   Rash of Face:  ? Broken capillaries for vomiting 80 mg Depo IM  today  Migraines:  Continue Imitrex prn RX for Zofran 4 mg TID prn  Return precautions discussed Nicki Reaper, NP

## 2018-12-30 ENCOUNTER — Ambulatory Visit (HOSPITAL_BASED_OUTPATIENT_CLINIC_OR_DEPARTMENT_OTHER): Payer: BLUE CROSS/BLUE SHIELD | Attending: Internal Medicine | Admitting: Radiology

## 2018-12-30 DIAGNOSIS — G4733 Obstructive sleep apnea (adult) (pediatric): Secondary | ICD-10-CM

## 2019-04-16 ENCOUNTER — Other Ambulatory Visit: Payer: Self-pay | Admitting: Family Medicine

## 2019-04-16 NOTE — Telephone Encounter (Signed)
Last OV was an acute appt with Webb Silversmith, NP on 12/24/18, last filled on 12/17/18 #9 tabs with 3 refills, please advise

## 2019-09-22 ENCOUNTER — Telehealth: Payer: Self-pay | Admitting: Family Medicine

## 2019-09-22 NOTE — Telephone Encounter (Signed)
Pt notified of Dr. Marliss Coots comments and instructions and verbalized understanding he will print letter from [facility]

## 2019-09-22 NOTE — Telephone Encounter (Signed)
I wrote the letter.  Re testing is not recommended as long as he isolates for 2 weeks.   Please alert [place] if he develops any symptoms   Please check in again in a few days to see how he is

## 2019-09-22 NOTE — Telephone Encounter (Signed)
Called pt and him and his wife both tested positive for covid today (they went to the health dpt). Pt said that he went out to eat with his grandmother on 09/11/19 and the next day 09/12/19 she was tested for covid and it was positive. Pt and wife went to the health dpt when they found out about grandmother's positive covid results and both were tested. 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 Pt said he just got the call from the health dpt that he was positive today so him and his wife left work and took his baby out of daycare and took her to get tested today. I advise pt since he has no sxs right now he just needs to quarantine for the 14 days that's recommended. Pt asked that Dr. Glori Bickers write a letter stating what he needs to do and when he can go back to work since his test was positive today. He did inform his job and pt's job needs a letter stating how long he has to be out of work, what timeframe he needs to be retested and when he can come back, he said if Dr. Glori Bickers writes letter he can print it off of his [employer]

## 2019-09-22 NOTE — Telephone Encounter (Signed)
Patient called and said he and his wife tested positive for covid.  Patient doesn't have any symptoms.  Patient wants to know what Dr.Tower recommends for patient to do. Patient uses [facility].

## 2019-09-22 NOTE — Telephone Encounter (Signed)
Is she symptomatic? If so what day did she begin symptoms?  What day was her positive test?   I would usually recommend testing 5-7 days after first exposure to covid positive individual  I can only assume they have close contact  Let me know

## 2019-09-28 ENCOUNTER — Other Ambulatory Visit: Payer: Self-pay | Admitting: Family Medicine

## 2019-09-28 DIAGNOSIS — J309 Allergic rhinitis, unspecified: Secondary | ICD-10-CM

## 2019-09-29 NOTE — Telephone Encounter (Signed)
This rx was last refilled on 11/19/17. Patient was last seen on 12/24/18 with Webb Silversmith for an acute issue/rash on face - and does not have any upcoming appts.  Patient did test positive for Covid on 09/22/19.  Is this ok to refill?

## 2020-01-14 ENCOUNTER — Encounter: Payer: Self-pay | Admitting: Family Medicine

## 2020-03-01 ENCOUNTER — Encounter: Payer: Self-pay | Admitting: Family Medicine

## 2020-03-02 NOTE — Telephone Encounter (Signed)
Pt called in wanting to schedule appt with Dr Milinda Antis on 03/03/20 in afternoon. Pt said pimple on rt leg is not any larger and is not draining;Pt has no covid symptoms, no travel and no known exposure to + covid. Pt scheduled in office appt on 03/03/20 at 2:30. UC precautions given and pt voiced understanding. FYI to Dr Milinda Antis.

## 2020-03-03 ENCOUNTER — Other Ambulatory Visit: Payer: Self-pay

## 2020-03-03 ENCOUNTER — Ambulatory Visit (INDEPENDENT_AMBULATORY_CARE_PROVIDER_SITE_OTHER): Payer: BC Managed Care – PPO | Admitting: Family Medicine

## 2020-03-03 ENCOUNTER — Encounter: Payer: Self-pay | Admitting: Family Medicine

## 2020-03-03 VITALS — BP 102/62 | HR 77 | Temp 97.4°F | Ht 67.75 in | Wt 197.4 lb

## 2020-03-03 DIAGNOSIS — I10 Essential (primary) hypertension: Secondary | ICD-10-CM | POA: Diagnosis not present

## 2020-03-03 DIAGNOSIS — Z23 Encounter for immunization: Secondary | ICD-10-CM

## 2020-03-03 DIAGNOSIS — G43C1 Periodic headache syndromes in child or adult, intractable: Secondary | ICD-10-CM | POA: Diagnosis not present

## 2020-03-03 DIAGNOSIS — J309 Allergic rhinitis, unspecified: Secondary | ICD-10-CM

## 2020-03-03 DIAGNOSIS — L739 Follicular disorder, unspecified: Secondary | ICD-10-CM | POA: Diagnosis not present

## 2020-03-03 MED ORDER — CEPHALEXIN 500 MG PO CAPS: 500.0000 mg | ORAL_CAPSULE | Freq: Two times a day (BID) | ORAL | 0 refills | Status: DC

## 2020-03-03 MED ORDER — CETIRIZINE HCL 10 MG PO TABS: 10.0000 mg | ORAL_TABLET | Freq: Every day | ORAL | 3 refills | Status: DC

## 2020-03-03 NOTE — Progress Notes (Signed)
Subjective:    Patient ID: Max Duffy, male    DOB: 1996-03-07, 24 y.o.   MRN: [id number]  This visit occurred during the SARS-CoV-2 public health emergency.  Safety protocols were in place, including screening questions prior to the visit, additional usage of staff PPE, and extensive cleaning of exam room while observing appropriate contact time as indicated for disinfecting solutions.    HPI Pt presents for c/o skin lesion/pimple on inside of R leg   Wt Readings from Last 3 Encounters:  03/03/20 197 lb 6 oz (89.5 kg)  12/24/18 233 lb (105.7 kg)  11/24/18 232 lb 12 oz (105.6 kg)   30.23 kg/m  Intentional weight loss   He went to a nutrition center and tried herbal life  Entered a wt loss challenge  Did meal prepping  Cut way back on carbs and cut back on soda /drank more water  Got off track at the holidays   Exercise - was doing some resistance training /push ups/sit ups  Walking now -wants to get back to that   Less migraines   Spot on R inner thigh - thought it may be an ingrown hair  Had a boil years ago on his leg (so was worried this could get worse)  It has gone down a little bit  (feels like something is underneath it)  No drainage  A little red  Not itchy    H/o HTN BP Readings from Last 3 Encounters:  03/03/20 102/62  12/24/18 128/82  11/24/18 126/76   Better with wt loss   Patient Active Problem List   Diagnosis Date Noted  . Folliculitis 03/03/2020  . Poison ivy dermatitis 04/21/2018  . Jock itch 08/19/2017  . Chronic nasal congestion 01/15/2017  . OSA (obstructive sleep apnea) 07/28/2015  . Excessive daytime sleepiness 06/19/2015  . Essential hypertension 06/19/2015  . Cognitive deficits 06/19/2015  . Somnolence, daytime 06/07/2015  . Snoring 06/07/2015  . Allergic rhinitis 06/07/2015  . Stress reaction 06/07/2015  . Constipation 04/21/2015  . Screening for HIV (human immunodeficiency virus) 04/21/2015  . Obesity 04/21/2015  .  Elevated transaminase level 12/18/2014  . Sinusitis, chronic 11/15/2014  . Migraine 11/15/2014  . Routine general medical examination at a health care facility 11/15/2014   Past Medical History:  Diagnosis Date  . History of headache    Past Surgical History:  Procedure Laterality Date  . KNEE SURGERY  2010  . SINUS IRRIGATION     age 72   Social History   Tobacco Use  . Smoking status: Former Smoker    Quit date: 11/04/2012    Years since quitting: 7.3  . Smokeless tobacco: Never Used  Substance Use Topics  . Alcohol use: Yes    Alcohol/week: 0.0 standard drinks    Comment: occ/rare  . Drug use: No   Family History  Problem Relation Age of Onset  . Hypertension Maternal Grandfather   . Diabetes Paternal Grandfather   . Hypertension Maternal Uncle   . Diabetes Father    No Known Allergies Current Outpatient Medications on File Prior to Visit  Medication Sig Dispense Refill  . fluticasone (FLONASE) 50 MCG/ACT nasal spray SPRAY 2 SPRAYS INTO EACH NOSTRIL EVERY DAY 16 mL 2  . ondansetron (ZOFRAN) 4 MG tablet Take 1 tablet (4 mg total) by mouth every 8 (eight) hours as needed. 20 tablet 0  . SUMAtriptan (IMITREX) 50 MG tablet TAKE 1 TABLET BY MOUTH ONCE AS NEEDED FOR MIGRAINE. MAY REPEAT IN  2 HOURS IF HEADACHE PERSISTS 9 tablet 5  . ketoconazole (NIZORAL) 2 % cream APPLY 1 APPLICATION TOPICALLY DAILY. TO AFFECTED AREAS AFTER CLEANING AND DRY AREAS WELL (Patient not taking: Reported on 03/03/2020) 30 g 1   No current facility-administered medications on file prior to visit.    Review of Systems  Constitutional: Negative for activity change, appetite change, fatigue, fever and unexpected weight change.       Happy with wt loss and better health habits  HENT: Negative for congestion, rhinorrhea, sore throat and trouble swallowing.   Eyes: Negative for pain, redness, itching and visual disturbance.  Respiratory: Negative for cough, chest tightness, shortness of breath and  wheezing.   Cardiovascular: Negative for chest pain and palpitations.  Gastrointestinal: Negative for abdominal pain, blood in stool, constipation, diarrhea and nausea.  Endocrine: Negative for cold intolerance, heat intolerance, polydipsia and polyuria.  Genitourinary: Negative for difficulty urinating, dysuria, frequency and urgency.  Musculoskeletal: Negative for arthralgias, joint swelling and myalgias.  Skin: Negative for pallor and rash.       Pimples on legs   Neurological: Negative for dizziness, tremors, weakness, numbness and headaches.  Hematological: Negative for adenopathy. Does not bruise/bleed easily.  Psychiatric/Behavioral: Negative for decreased concentration and dysphoric mood. The patient is not nervous/anxious.        Objective:   Physical Exam Constitutional:      General: He is not in acute distress.    Appearance: Normal appearance. He is well-developed. He is not ill-appearing or diaphoretic.     Comments: Significant weight loss noted Muscular build  HENT:     Head: Normocephalic and atraumatic.  Eyes:     General: No scleral icterus.    Conjunctiva/sclera: Conjunctivae normal.     Pupils: Pupils are equal, round, and reactive to light.  Neck:     Thyroid: No thyromegaly.     Vascular: No carotid bruit or JVD.  Cardiovascular:     Rate and Rhythm: Normal rate and regular rhythm.     Heart sounds: Normal heart sounds. No gallop.   Pulmonary:     Effort: Pulmonary effort is normal. No respiratory distress.     Breath sounds: Normal breath sounds. No wheezing or rales.  Abdominal:     General: Bowel sounds are normal. There is no distension or abdominal bruit.     Palpations: Abdomen is soft.  Musculoskeletal:     Cervical back: Normal range of motion and neck supple.     Right lower leg: No edema.     Left lower leg: No edema.  Lymphadenopathy:     Cervical: No cervical adenopathy.  Skin:    General: Skin is warm and dry.     Findings: No rash.       Comments: Folliculitis with some papules overlying hair follicles in legs- scattered Largest one is R inner thigh-approx 1 cm of erythema and induration (no drainage or fluctuance and mildly tender)    Neurological:     Mental Status: He is alert.     Sensory: No sensory deficit.     Deep Tendon Reflexes: Reflexes are normal and symmetric.  Psychiatric:        Mood and Affect: Mood normal.           Assessment & Plan:   Problem List Items Addressed This Visit      Cardiovascular and Mediastinum   Migraine    Improved with better health habits and wt loss  RESOLVED: Essential hypertension    Very good bp control with weight loss/better health habits  Enc him to keep it up         Respiratory   Allergic rhinitis    Worse in spring  Sent in px for zyrtec  Symptom avoidance recommended      Relevant Medications   cetirizine (ZYRTEC) 10 MG tablet     Musculoskeletal and Integument   Folliculitis - Primary    Pt has been prone to this for years  Disc use of soap and water for cleansing-esp after sweating/exercise  Keflex px today since he has multiple active areas  inst to avoid picking/expressing lesions  otc abx ointment/warm compresses prn  Update if not starting to improve in a week or if worsening         Other Visit Diagnoses    Need for tetanus, diphtheria, and acellular pertussis (Tdap) vaccine       Relevant Orders   Tdap vaccine greater than or equal to 7yo IM (Completed)

## 2020-03-03 NOTE — Patient Instructions (Addendum)
I think you have folliculitis  Keep clean with soap and water   Antibiotic ointment over the counter is helpful also on individual spots  Use a warm compress on the largest area   If affordable- get the keflex and take as directed   Try not to squeeze /manipulate lesions   Bathe after exercise and sweating when you can

## 2020-03-05 NOTE — Assessment & Plan Note (Signed)
Worse in spring  Sent in px for zyrtec  Symptom avoidance recommended

## 2020-03-05 NOTE — Assessment & Plan Note (Signed)
Pt has been prone to this for years  Disc use of soap and water for cleansing-esp after sweating/exercise  Keflex px today since he has multiple active areas  inst to avoid picking/expressing lesions  otc abx ointment/warm compresses prn  Update if not starting to improve in a week or if worsening

## 2020-03-05 NOTE — Assessment & Plan Note (Signed)
Very good bp control with weight loss/better health habits  Enc him to keep it up

## 2020-03-05 NOTE — Assessment & Plan Note (Signed)
Improved with better health habits and wt loss

## 2020-07-18 ENCOUNTER — Encounter: Payer: Self-pay | Admitting: Family Medicine

## 2020-08-14 ENCOUNTER — Encounter: Payer: Self-pay | Admitting: Family Medicine

## 2020-08-16 ENCOUNTER — Ambulatory Visit
Admission: EM | Admit: 2020-08-16 | Discharge: 2020-08-16 | Disposition: A | Discharge status: Home / Self Care | Payer: BC Managed Care – PPO | Attending: Emergency Medicine | Admitting: Emergency Medicine

## 2020-08-16 ENCOUNTER — Other Ambulatory Visit: Payer: Self-pay

## 2020-08-16 DIAGNOSIS — J069 Acute upper respiratory infection, unspecified: Secondary | ICD-10-CM | POA: Diagnosis not present

## 2020-08-16 MED ORDER — BENZONATATE 100 MG PO CAPS: 100.0000 mg | ORAL_CAPSULE | Freq: Three times a day (TID) | ORAL | 0 refills | Status: DC

## 2020-08-16 MED ORDER — CETIRIZINE HCL 10 MG PO TABS: 10.0000 mg | ORAL_TABLET | Freq: Every day | ORAL | 0 refills | Status: DC

## 2020-08-16 MED ORDER — FLUTICASONE PROPIONATE 50 MCG/ACT NA SUSP: 1.0000 | Freq: Every day | NASAL | 0 refills | Status: DC

## 2020-08-16 NOTE — Telephone Encounter (Signed)
Max Duffy called pt and he said he's starting to feel better. He said he got his 2nd covid shot last Tuesday. He said he's been taking vitamin c and immune boosters. He said he'd wait to make an appointment.

## 2020-08-16 NOTE — Discharge Instructions (Addendum)

## 2020-08-16 NOTE — ED Triage Notes (Signed)
Pt states he has had a cough, congestion, and body aches since Saturday. These symptoms improve and worsen but have not completely gone away. Pt is aox4 and ambulatory with no complaint of fever.

## 2020-08-16 NOTE — ED Provider Notes (Signed)
[facility]    CSN: [id number] Arrival date & time: 08/16/20  1617      History   Chief Complaint Chief Complaint  Patient presents with  . Cough    since saturday  . Generalized Body Aches    since saturday  . Nasal Congestion    since saturday    HPI Max Duffy is a 24 y.o. male  Presenting for URI symptoms since Saturday.  Underwent Covid testing on day 2 of symptoms: Negative.  No known contacts.  Endorsing dry cough, nasal congestion, myalgias.  States that they got better around day 4, the resumed day 5/6.  No difficulty breathing, chest pain, palpitations, vomiting or diarrhea.  Tolerating oral intake well.  Past Medical History:  Diagnosis Date  . History of headache     Patient Active Problem List   Diagnosis Date Noted  . Folliculitis 03/03/2020  . Poison ivy dermatitis 04/21/2018  . Jock itch 08/19/2017  . Chronic nasal congestion 01/15/2017  . OSA (obstructive sleep apnea) 07/28/2015  . Excessive daytime sleepiness 06/19/2015  . Cognitive deficits 06/19/2015  . Somnolence, daytime 06/07/2015  . Snoring 06/07/2015  . Allergic rhinitis 06/07/2015  . Stress reaction 06/07/2015  . Constipation 04/21/2015  . Screening for HIV (human immunodeficiency virus) 04/21/2015  . Obesity 04/21/2015  . Elevated transaminase level 12/18/2014  . Sinusitis, chronic 11/15/2014  . Migraine 11/15/2014  . Routine general medical examination at a health care facility 11/15/2014    Past Surgical History:  Procedure Laterality Date  . KNEE SURGERY  2010  . SINUS IRRIGATION     age 52       Home Medications    Prior to Admission medications   Medication Sig Start Date End Date Taking? Authorizing Provider  ketoconazole (NIZORAL) 2 % cream APPLY 1 APPLICATION TOPICALLY DAILY. TO AFFECTED AREAS AFTER CLEANING AND DRY AREAS WELL 11/24/18  Yes Tower, Audrie Gallus, MD  SUMAtriptan (IMITREX) 50 MG tablet TAKE 1 TABLET BY MOUTH ONCE AS NEEDED FOR MIGRAINE.  MAY REPEAT IN 2 HOURS IF HEADACHE PERSISTS 04/16/19  Yes Tower, Audrie Gallus, MD  benzonatate (TESSALON) 100 MG capsule Take 1 capsule (100 mg total) by mouth every 8 (eight) hours. 08/16/20   Hall-Potvin, [place], PA-C  cetirizine (ZYRTEC ALLERGY) 10 MG tablet Take 1 tablet (10 mg total) by mouth daily. 08/16/20   Hall-Potvin, [place], PA-C  fluticasone (FLONASE) 50 MCG/ACT nasal spray Place 1 spray into both nostrils daily. 08/16/20   Hall-Potvin, [place], PA-C  ondansetron (ZOFRAN) 4 MG tablet Take 1 tablet (4 mg total) by mouth every 8 (eight) hours as needed. 12/24/18   Lorre Munroe, NP    Family History Family History  Problem Relation Age of Onset  . Hypertension Maternal Grandfather   . Diabetes Paternal Grandfather   . Hypertension Maternal Uncle   . Diabetes Father     Social History Social History   Tobacco Use  . Smoking status: Former Smoker    Quit date: 11/04/2012    Years since quitting: 7.7  . Smokeless tobacco: Never Used  Vaping Use  . Vaping Use: Former  Substance Use Topics  . Alcohol use: Yes    Alcohol/week: 0.0 standard drinks    Comment: occ/rare  . Drug use: No     Allergies   Patient has no known allergies.   Review of Systems As per HPI   Physical Exam Triage Vital Signs ED Triage Vitals  Enc Vitals Group  BP      Pulse      Resp      Temp      Temp src      SpO2      Weight      Height      Head Circumference      Peak Flow      Pain Score      Pain Loc      Pain Edu?      Excl. in GC?    No data found.  Updated Vital Signs BP (!) 141/87 (BP Location: Left Arm)   Pulse 71   Temp 98 F (36.7 C) (Oral)   Resp 17   SpO2 99%   Visual Acuity Right Eye Distance:   Left Eye Distance:   Bilateral Distance:    Right Eye Near:   Left Eye Near:    Bilateral Near:     Physical Exam Constitutional:      General: He is not in acute distress.    Appearance: He is not toxic-appearing or diaphoretic.  HENT:     Head:  Normocephalic and atraumatic.     Mouth/Throat:     Mouth: Mucous membranes are moist.     Pharynx: Oropharynx is clear.  Eyes:     General: No scleral icterus.    Conjunctiva/sclera: Conjunctivae normal.     Pupils: Pupils are equal, round, and reactive to light.  Neck:     Comments: Trachea midline, negative JVD Cardiovascular:     Rate and Rhythm: Normal rate and regular rhythm.  Pulmonary:     Effort: Pulmonary effort is normal. No respiratory distress.     Breath sounds: No wheezing.  Musculoskeletal:     Cervical back: Neck supple. No tenderness.  Lymphadenopathy:     Cervical: No cervical adenopathy.  Skin:    Capillary Refill: Capillary refill takes less than 2 seconds.     Coloration: Skin is not jaundiced or pale.     Findings: No rash.  Neurological:     Mental Status: He is alert and oriented to person, place, and time.      UC Treatments / Results  Labs (all labs ordered are listed, but only abnormal results are displayed) Labs Reviewed  NOVEL CORONAVIRUS, NAA    EKG   Radiology No results found.  Procedures Procedures (including critical care time)  Medications Ordered in UC Medications - No data to display  Initial Impression / Assessment and Plan / UC Course  I have reviewed the triage vital signs and the nursing notes.  Pertinent labs & imaging results that were available during my care of the patient were reviewed by me and considered in my medical decision making (see chart for details).      Patient afebrile, nontoxic, with SpO2 99%.  Covid PCR pending.  Patient to quarantine until results are back.  We will treat supportively as outlined below.  Return precautions discussed, patient verbalized understanding and is agreeable to plan. Final Clinical Impressions(s) / UC Diagnoses   Final diagnoses:  URI with cough and congestion     Discharge Instructions     Tessalon for cough. Start flonase, atrovent nasal spray for nasal  congestion/drainage. You can use over the counter nasal saline rinse such as neti pot for nasal congestion. Keep hydrated, your urine should be clear to pale yellow in color. Tylenol/motrin for fever and pain. Monitor for any worsening of symptoms, chest pain, shortness of breath, wheezing, swelling  of the throat, go to the emergency department for further evaluation needed.     ED Prescriptions    Medication Sig Dispense Auth. Provider   benzonatate (TESSALON) 100 MG capsule Take 1 capsule (100 mg total) by mouth every 8 (eight) hours. 21 capsule Hall-Potvin, [place], PA-C   cetirizine (ZYRTEC ALLERGY) 10 MG tablet Take 1 tablet (10 mg total) by mouth daily. 30 tablet Hall-Potvin, [place], PA-C   fluticasone (FLONASE) 50 MCG/ACT nasal spray Place 1 spray into both nostrils daily. 16 g Hall-Potvin, [place], PA-C     PDMP not reviewed this encounter.   Hall-Potvin, [place], [place] 08/16/20 1733

## 2020-08-17 ENCOUNTER — Telehealth: Payer: BC Managed Care – PPO | Admitting: Family Medicine

## 2020-08-17 LAB — NOVEL CORONAVIRUS, NAA: SARS-CoV-2, NAA: NOT DETECTED

## 2020-08-17 LAB — SARS-COV-2, NAA 2 DAY TAT

## 2020-11-14 ENCOUNTER — Telehealth: Payer: Self-pay | Admitting: Family Medicine

## 2020-11-14 DIAGNOSIS — Z8371 Family history of colonic polyps: Secondary | ICD-10-CM

## 2020-11-14 NOTE — Telephone Encounter (Signed)
Please clarify- pill what?   (in terms of symptoms) Thanks   Let me know and I will do the referral

## 2020-11-14 NOTE — Telephone Encounter (Signed)
Referral Request - Has patient seen PCP for this complaint? No. *If NO, is insurance requiring patient see PCP for this issue before PCP can refer them? Referral for which specialty: gastro Preferred provider/office: dr.tulito with [facility] Reason for referral: pill ups/ colonoscopy inquiry

## 2020-11-15 DIAGNOSIS — Z8371 Family history of colonic polyps: Secondary | ICD-10-CM | POA: Insufficient documentation

## 2020-11-15 NOTE — Telephone Encounter (Signed)
Called pt and he said he's not having any issues right now, but his mother has a colonoscopy every 5 years since she was 65 due to excessive polyps that they found when she was only 15, and every 5 years during her Colonoscopy she usually has more polyps removed. Pt's mother already sees Dr. Delsa Sale with [facility] and during her last appt Dr. Delsa Sale told her that this could be hereditary and if she has any kids that they should have had a baseline colonoscopy by at least 18. Pt tried to schedule an appt with Dr. Delsa Sale directly to discuss this but he was advise we have to put the referral in.

## 2020-11-15 NOTE — Telephone Encounter (Signed)
Thanks  Referral done He will get a call

## 2020-11-27 NOTE — Telephone Encounter (Signed)
Patient was unable to contact his provider's office, [facility]. He says that he called [facility] and they do not have his colonoscopy referral.

## 2020-11-27 NOTE — Telephone Encounter (Signed)
Looks like referral was faxed to the wrong number. I have re sent it through [employer]. Patient is advised of everything and I apologized for the delay in this.

## 2020-11-27 NOTE — Telephone Encounter (Signed)
Will route to our PCCs to look into this

## 2021-01-09 ENCOUNTER — Other Ambulatory Visit: Payer: Self-pay | Admitting: Internal Medicine

## 2021-01-11 LAB — SURGICAL PATHOLOGY

## 2021-04-25 ENCOUNTER — Encounter: Payer: Self-pay | Admitting: Family Medicine

## 2021-04-25 DIAGNOSIS — J309 Allergic rhinitis, unspecified: Secondary | ICD-10-CM

## 2021-04-25 DIAGNOSIS — K9049 Malabsorption due to intolerance, not elsewhere classified: Secondary | ICD-10-CM

## 2021-04-27 DIAGNOSIS — K9049 Malabsorption due to intolerance, not elsewhere classified: Secondary | ICD-10-CM | POA: Insufficient documentation

## 2021-07-10 ENCOUNTER — Ambulatory Visit: Payer: Managed Care, Other (non HMO) | Admitting: Family Medicine

## 2021-07-10 ENCOUNTER — Other Ambulatory Visit: Payer: Self-pay

## 2021-07-10 ENCOUNTER — Encounter: Payer: Self-pay | Admitting: Family Medicine

## 2021-07-10 ENCOUNTER — Telehealth: Payer: Self-pay | Admitting: Family Medicine

## 2021-07-10 DIAGNOSIS — R234 Changes in skin texture: Secondary | ICD-10-CM | POA: Insufficient documentation

## 2021-07-10 MED ORDER — MUPIROCIN 2 % EX OINT: 1.0000 "application " | TOPICAL_OINTMENT | Freq: Two times a day (BID) | CUTANEOUS | 0 refills | Status: DC

## 2021-07-10 NOTE — Progress Notes (Signed)
Subjective:    Patient ID: Max Duffy, male    DOB: [birth year], 25 y.o.   MRN: [id number]  This visit occurred during the SARS-CoV-2 public health emergency.  Safety protocols were in place, including screening questions prior to the visit, additional usage of staff PPE, and extensive cleaning of exam room while observing appropriate contact time as indicated for disinfecting solutions.   HPI Pt presents with wound/bleeding from belly button  Wt Readings from Last 3 Encounters:  07/10/21 225 lb 1 oz (102.1 kg)  03/03/20 197 lb 6 oz (89.5 kg)  12/24/18 233 lb (105.7 kg)   34.47 kg/m  Blood coming out of belly button Noted dry blood last week  Noted few drops on cotton ball  Tender only when he messes with it  (tingling)  No pus  No odor that he can tell   No folliculitis recently   Has his own pool = in that from time to time Bought an old house, has 2 acres, plays with 36 yo son Doing very well    Has h/o folliculitis-for years  Lab Results  Component Value Date   ALT 53 01/15/2017   AST 35 01/15/2017   ALKPHOS 45 01/15/2017   BILITOT 0.4 01/15/2017   Lab Results  Component Value Date   WBC 8.9 01/19/2016   HGB 14.7 01/19/2016   HCT 43.0 01/19/2016   MCV 82 01/19/2016   PLT 307 01/19/2016   Patient Active Problem List   Diagnosis Date Noted   Scab 07/10/2021   Food intolerance 04/27/2021   Family history of colonic polyps 11/15/2020   Folliculitis 03/03/2020   Poison ivy dermatitis 04/21/2018   Jock itch 08/19/2017   Chronic nasal congestion 01/15/2017   OSA (obstructive sleep apnea) 07/28/2015   Excessive daytime sleepiness 06/19/2015   Cognitive deficits 06/19/2015   Somnolence, daytime 06/07/2015   Snoring 06/07/2015   Allergic rhinitis 06/07/2015   Stress reaction 06/07/2015   Constipation 04/21/2015   Screening for HIV (human immunodeficiency virus) 04/21/2015   Obesity 04/21/2015   Elevated transaminase level 12/18/2014   Sinusitis,  chronic 11/15/2014   Migraine 11/15/2014   Routine general medical examination at a health care facility 11/15/2014   Past Medical History:  Diagnosis Date   History of headache    Past Surgical History:  Procedure Laterality Date   KNEE SURGERY  2010   SINUS IRRIGATION     age 66   Social History   Tobacco Use   Smoking status: Former    Types: Cigarettes    Quit date: 11/04/2012    Years since quitting: 8.6   Smokeless tobacco: Never  Vaping Use   Vaping Use: Former  Substance Use Topics   Alcohol use: Yes    Alcohol/week: 0.0 standard drinks    Comment: occ/rare   Drug use: No   Family History  Problem Relation Age of Onset   Hypertension Maternal Grandfather    Diabetes Paternal Grandfather    Hypertension Maternal Uncle    Diabetes Father    No Known Allergies Current Outpatient Medications on File Prior to Visit  Medication Sig Dispense Refill   aspirin-acetaminophen-caffeine (EXCEDRIN MIGRAINE) 250-250-65 MG tablet Take 2 tablets by mouth every 6 (six) hours as needed for headache.     cetirizine (ZYRTEC ALLERGY) 10 MG tablet Take 1 tablet (10 mg total) by mouth daily. (Patient taking differently: Take 10 mg by mouth daily as needed.) 30 tablet 0   fluticasone (FLONASE) 50 MCG/ACT nasal  spray Place 1 spray into both nostrils daily. 16 g 0   ketoconazole (NIZORAL) 2 % cream APPLY 1 APPLICATION TOPICALLY DAILY. TO AFFECTED AREAS AFTER CLEANING AND DRY AREAS WELL (Patient taking differently: Apply 1 application topically daily as needed. To affected areas after cleaning and dry areas well) 30 g 1   ondansetron (ZOFRAN) 4 MG tablet Take 1 tablet (4 mg total) by mouth every 8 (eight) hours as needed. 20 tablet 0   SUMAtriptan (IMITREX) 50 MG tablet TAKE 1 TABLET BY MOUTH ONCE AS NEEDED FOR MIGRAINE. MAY REPEAT IN 2 HOURS IF HEADACHE PERSISTS 9 tablet 5   No current facility-administered medications on file prior to visit.      Review of Systems Review of Systems   Constitutional:  Negative for chills, diaphoresis, fever, malaise/fatigue and weight loss.  HENT:         Nasal allergy symptoms   Eyes:  Negative for pain.  Respiratory:  Negative for cough.   Musculoskeletal:  Negative for myalgias.  Skin:  Negative for itching and rash.  Neurological:  Positive for headaches.       Less headaches  Endo/Heme/Allergies:  Positive for environmental allergies.      Objective:   Physical Exam Physical Exam Constitutional:      General: He is not in acute distress.    Appearance: Normal appearance. He is obese. He is not ill-appearing or diaphoretic.  HENT:     Head: Normocephalic and atraumatic.  Eyes:     General:        Right eye: No discharge.        Left eye: No discharge.     Conjunctiva/sclera: Conjunctivae normal.     Pupils: Pupils are equal, round, and reactive to light.  Cardiovascular:     Rate and Rhythm: Normal rate and regular rhythm.  Pulmonary:     Breath sounds: Normal breath sounds.  Abdominal:     General: Abdomen is flat. Bowel sounds are normal. There is no distension.     Palpations: Abdomen is soft. There is no mass.     Tenderness: There is no abdominal tenderness. There is no guarding or rebound.     Hernia: No hernia is present.  Musculoskeletal:     Cervical back: Normal range of motion and neck supple. No tenderness.  Lymphadenopathy:     Cervical: No cervical adenopathy.  Skin:    General: Skin is warm and dry.     Coloration: Skin is not pale.     Findings: No rash.     Comments: Small scab/oozing blood deep in umbilicus  Scant erythema  No fluctuance or pus  No odor to discharge  No hernia noted  Neurological:     Mental Status: He is alert.     Sensory: No sensory deficit.  Psychiatric:        Mood and Affect: Mood normal.          Assessment & Plan:   Problem List Items Addressed This Visit       Musculoskeletal and Integument   Scab    Small scab/wound in umbilicus with minimal  tenderness H/o folliculitis in the past   inst to keep clean with soap and water  Dry it well (gauze) Apply bactroban twice daily and avoid submerging(pool) Watch for inc redness/pain or d/c Update if not starting to improve in a week or if worsening  ER precautions discussed

## 2021-07-10 NOTE — Telephone Encounter (Signed)
Rx sent to new pharmacy.

## 2021-07-10 NOTE — Assessment & Plan Note (Signed)
Small scab/wound in umbilicus with minimal tenderness H/o folliculitis in the past   inst to keep clean with soap and water  Dry it well (gauze) Apply bactroban twice daily and avoid submerging(pool) Watch for inc redness/pain or d/c Update if not starting to improve in a week or if worsening  ER precautions discussed

## 2021-07-10 NOTE — Patient Instructions (Addendum)
I see a small scab/sore in your belly button  Keep it clean with soap and water  Protect from pool water when you can   Use a qtip (gentle) or gauze to dry   Use the mupirocin ointment twice daily until it heals and bleeding stops   If worse or pain or swelling/redness let me know

## 2021-07-10 NOTE — Telephone Encounter (Signed)
Patient would like to change Pharmacy to  [facility] on [address] [employer]

## 2021-07-10 NOTE — Telephone Encounter (Signed)
Per PCP she wants to see him in office for this. Appt scheduled at 12:30 today

## 2021-07-17 ENCOUNTER — Encounter: Payer: Self-pay | Admitting: Allergy and Immunology

## 2021-07-17 ENCOUNTER — Other Ambulatory Visit: Payer: Self-pay

## 2021-07-17 ENCOUNTER — Ambulatory Visit: Payer: Managed Care, Other (non HMO) | Admitting: Allergy and Immunology

## 2021-07-17 VITALS — BP 134/82 | HR 90 | Temp 98.1°F | Resp 16 | Ht 67.75 in | Wt 228.0 lb

## 2021-07-17 DIAGNOSIS — G4733 Obstructive sleep apnea (adult) (pediatric): Secondary | ICD-10-CM

## 2021-07-17 DIAGNOSIS — J3089 Other allergic rhinitis: Secondary | ICD-10-CM | POA: Diagnosis not present

## 2021-07-17 DIAGNOSIS — G43909 Migraine, unspecified, not intractable, without status migrainosus: Secondary | ICD-10-CM

## 2021-07-17 NOTE — Patient Instructions (Addendum)
  1.  Allergen avoidance measures - dust mite  2.  Treat and prevent inflammation of upper airway:  A.  OTC Flonase/Nasacort -1-2 sprays each nostril 3-7 times per week  3.  Treat and prevent headache:  A. Consolidate caffeine use slowly B. Periactin 4 mg - 1/2 tablet at bedtime  4. If needed:  A. Nasal saline B. Cetirizine 10 mg - 1 tablet 1 time per day C. Excedrin (caffeine)  5. Consider mandibular advancement device (MAD)???  Insurance coverage???  6. Return to clinic in 4 weeks or earlier if problem  7. Plan for fall flu vaccine

## 2021-07-17 NOTE — Progress Notes (Signed)
[place] - [place] - [place] - [place] - [place]   Dear Dr. Milinda Antis,  Thank you for referring Max Duffy to the [facility] [place] on 07/17/2021.   Below is a summation of this patient's evaluation and recommendations.  Thank you for your referral. I will keep you informed about this patient's response to treatment.   If you have any questions please do not hesitate to contact me.   Sincerely,  Jessica Priest, MD Allergy / Immunology [place] [facility]   ______________________________________________________________________    NEW PATIENT NOTE  Referring Provider: Judy Pimple, MD Primary Provider: Judy Pimple, MD Date of office visit: 07/17/2021    Subjective:   Chief Complaint:  Max Duffy (DOB: [birth year]) is a 25 y.o. male who presents to the clinic on 07/17/2021 with a chief complaint of Establish Care and Allergy Testing .     HPI: Max Duffy presents to this clinic in evaluation of 2 issues.  First, he has migraine headaches and has had them for several years and has seen both an ENT and a neurologist regarding this issue.  Apparently he was diagnosed with sleep apnea although it was "not that bad" although he was given a CPAP machine which he is not using.  His headaches are manifested as a pressure-like occasional pounding pain in the back of his neck that migrates to his face unassociated with scotoma or dizziness but associated with nausea and vomiting.  His headache will last 1 day until he can get to sleep.  He has been given Imitrex in the past which worked really well but unfortunately it is too expensive.  He now takes Excedrin and if he can get an Excedrin very early at the onset of his migraine it works pretty well.  He does have some medications for nausea as well.  Provoking factors for his headache may be consumption of food.  If he eats peanuts or greasy food  or spicy food he sometimes gets a migraine.  If he is outdoors he can sometimes get a migraine especially if he is working outdoors and getting hot.  He does drink 1 coffee per day and does not really consume any chocolate and rarely has any ethanol.  If he drinks a dark beer this can precipitate a headache.  Second, he has spring and fall nasal congestion and sneezing and he has a little bit of baseline nasal congestion all the time.  He will take cetirizine and Flonase as needed sometimes during the spring and fall but not very often.  He apparently required a sinus surgery at the age of 2 for "small nasal cavity and infection behind his eye".   Past Medical History:  Diagnosis Date   History of headache     Past Surgical History:  Procedure Laterality Date   KNEE SURGERY  2010   SINUS IRRIGATION     age 74    Allergies as of 07/17/2021   No Known Allergies      Medication List    aspirin-acetaminophen-caffeine 250-250-65 MG tablet Commonly known as: EXCEDRIN MIGRAINE Take 2 tablets by mouth every 6 (six) hours as needed for headache.   cetirizine 10 MG tablet Commonly known as: ZyrTEC Allergy Take 1 tablet (10 mg total) by mouth daily.   fluticasone 50 MCG/ACT nasal spray Commonly known as: FLONASE Place 1 spray into both nostrils daily.   ketoconazole 2 % cream  Commonly known as: NIZORAL APPLY 1 APPLICATION TOPICALLY DAILY. TO AFFECTED AREAS AFTER CLEANING AND DRY AREAS WELL   ondansetron 4 MG tablet Commonly known as: ZOFRAN Take 1 tablet (4 mg total) by mouth every 8 (eight) hours as needed.    Review of systems negative except as noted in HPI / PMHx or noted below:  Review of Systems  Constitutional: Negative.   HENT: Negative.    Eyes: Negative.   Respiratory: Negative.    Cardiovascular: Negative.   Gastrointestinal: Negative.   Genitourinary: Negative.   Musculoskeletal: Negative.   Skin: Negative.   Neurological: Negative.   Endo/Heme/Allergies:  Negative.   Psychiatric/Behavioral: Negative.     Family History  Problem Relation Age of Onset   Hypertension Maternal Grandfather    Diabetes Paternal Grandfather    Hypertension Maternal Uncle    Diabetes Father     Social History   Socioeconomic History   Marital status: Married    Spouse name: Not on file   Number of children: Not on file   Years of education: Not on file   Highest education level: Not on file  Occupational History   Not on file  Tobacco Use   Smoking status: Former    Types: Cigarettes    Quit date: 11/04/2012    Years since quitting: 8.7   Smokeless tobacco: Never  Vaping Use   Vaping Use: Former  Substance and Sexual Activity   Alcohol use: Yes    Alcohol/week: 0.0 standard drinks    Comment: occ/rare   Drug use: No   Sexual activity: Yes  Other Topics Concern   Not on file  Social History Narrative   Not on file   Environmental and Social history  Lives in a house with a dry environment, no animals located inside the household, carpet in the bedroom, no plastic on the bed, no plastic on the pillow, no smoking ongoing with inside the household.  He works in a Magazine features editor and driving.  Objective:   Vitals:   07/17/21 1404  BP: 134/82  Pulse: 90  Resp: 16  Temp: 98.1 F (36.7 C)  SpO2: 98%   Height: 5' 7.75" (172.1 cm) Weight: 228 lb (103.4 kg)  Physical Exam Constitutional:      Appearance: He is not diaphoretic.  HENT:     Head: Normocephalic.     Right Ear: Tympanic membrane, ear canal and external ear normal.     Left Ear: Tympanic membrane, ear canal and external ear normal.     Nose: Nose normal. No mucosal edema or rhinorrhea.     Mouth/Throat:     Pharynx: Uvula midline. No oropharyngeal exudate.  Eyes:     Conjunctiva/sclera: Conjunctivae normal.  Neck:     Thyroid: No thyromegaly.     Trachea: Trachea normal. No tracheal tenderness or tracheal deviation.  Cardiovascular:     Rate and Rhythm:  Normal rate and regular rhythm.     Heart sounds: Normal heart sounds, S1 normal and S2 normal. No murmur heard. Pulmonary:     Effort: No respiratory distress.     Breath sounds: Normal breath sounds. No stridor. No wheezing or rales.  Lymphadenopathy:     Head:     Right side of head: No tonsillar adenopathy.     Left side of head: No tonsillar adenopathy.     Cervical: No cervical adenopathy.  Skin:    Findings: No erythema or rash.     Nails: There is  no clubbing.  Neurological:     Mental Status: He is alert.    Diagnostics: Allergy skin tests were performed.  He demonstrated hypersensitivity to house dust mite.  He did not demonstrate any hypersensitivity to a screening panel of foods.  Results of a sleep study obtained 24 November 2018 identified oxygen saturation below 90% at 5.4 minutes, below 89% at 3.7 minutes, and below 85% at 1.3 minutes. His AHI was 14.9  Assessment and Plan:    1. Migraine syndrome   2. Perennial allergic rhinitis   3. OSA (obstructive sleep apnea)    1.  Allergen avoidance measures - dust mite  2.  Treat and prevent inflammation of upper airway:  A.  OTC Flonase/Nasacort -1-2 sprays each nostril 3-7 times per week  3.  Treat and prevent headache:  A. Consolidate caffeine use slowly B. Periactin 4 mg - 1/2 tablet at bedtime  4. If needed:  A. Nasal saline B. Cetirizine 10 mg - 1 tablet 1 time per day C. Excedrin (caffeine)  5. Consider mandibular advancement device (MAD)???  Insurance coverage???  6. Return to clinic in 4 weeks or earlier if problem  7. Plan for fall flu vaccine  Calbert appears to have migraine headaches and there may be an atopic trigger contributing to this headache syndrome as well as some of his airway issues but there is no doubt a sleep apnea trigger contributing to this issue.  I am going to have him perform allergen avoidance measures, use some anti-inflammatory agents for his upper airway to minimize any  inflammation and hopefully increase the conduit size of his airway and also give him a low-dose of cyproheptadine in an attempt to prevent headaches.  I did have a talk with him today about some of the options for treating his sleep apnea as it does not appear as though he will tolerate a CPAP machine.  He can seek out an oral surgeon to see if he would qualify for a mandibular advancement device and hopefully this device would be covered by insurance.  I will regroup with him in 4 weeks or earlier if there is a problem.   Jessica Priest, MD Allergy / Immunology [place] [place]

## 2021-07-20 ENCOUNTER — Telehealth: Payer: Self-pay

## 2021-07-20 NOTE — Telephone Encounter (Signed)
Please sign and add diagnosis for allergy testing.

## 2021-07-24 ENCOUNTER — Encounter: Payer: Self-pay | Admitting: Allergy and Immunology

## 2021-07-24 MED ORDER — CYPROHEPTADINE HCL 4 MG PO TABS: ORAL_TABLET | ORAL | 5 refills | Status: DC

## 2021-07-24 MED ORDER — TRIAMCINOLONE ACETONIDE 55 MCG/ACT NA AERO: 2.0000 | INHALATION_SPRAY | Freq: Every day | NASAL | 5 refills | Status: DC

## 2021-07-24 MED ORDER — CETIRIZINE HCL 10 MG PO TABS: 10.0000 mg | ORAL_TABLET | Freq: Every day | ORAL | 5 refills | Status: DC

## 2021-10-08 ENCOUNTER — Other Ambulatory Visit: Payer: Self-pay

## 2021-10-08 ENCOUNTER — Ambulatory Visit (INDEPENDENT_AMBULATORY_CARE_PROVIDER_SITE_OTHER): Payer: Managed Care, Other (non HMO) | Admitting: Family Medicine

## 2021-10-08 ENCOUNTER — Encounter: Payer: Self-pay | Admitting: Family Medicine

## 2021-10-08 VITALS — BP 104/80 | HR 85 | Temp 98.3°F | Ht 67.75 in | Wt 244.2 lb

## 2021-10-08 DIAGNOSIS — R051 Acute cough: Secondary | ICD-10-CM

## 2021-10-08 DIAGNOSIS — J028 Acute pharyngitis due to other specified organisms: Secondary | ICD-10-CM | POA: Diagnosis not present

## 2021-10-08 LAB — POCT RAPID STREP A (OFFICE): Rapid Strep A Screen: NEGATIVE

## 2021-10-08 NOTE — Patient Instructions (Signed)
SORE THROAT -Most are viral injections.  -Strep throat is bacterial and requires antibiotics -Drainage and cough can irritate throat  TREATMENT 1. Warm liquids, salt water gargles to help with the sore throat. 2. Chloraseptic as needed can help a lot. YOU CANNOT OVERDOSE ON CHLORASEPTIC. I PERSONALLY USE IT ABOUT EVERY 20 MINUTES WITH A SORE THROAT. 3. Cough drops, popsicles, or hard candy 4. Liquids - drink plenty, without caffeine 5. Salt water gargle: 1/2 tsp salt in 1/2 glass warm water 6. Avoid spicy food 7. Get plenty of sleep 8. Ice chips for comfort

## 2021-10-08 NOTE — Progress Notes (Signed)
Max Duffy T. Ravin Denardo, MD, CAQ Sports Medicine [facility] at [facility] [address] [place], [address]  Phone: [phone]  FAX: [phone]  Max Duffy - 25 y.o. male  MRN [id number]  Date of Birth: May 28, 1996  Date: 10/08/2021  PCP: Judy Pimple, MD  Referral: Judy Pimple, MD  Chief Complaint  Patient presents with   Sore Throat    Return from Cruise yesterday. Throat started hurting last Wednesday.  Negative home Covid test today    This visit occurred during the SARS-CoV-2 public health emergency.  Safety protocols were in place, including screening questions prior to the visit, additional usage of staff PPE, and extensive cleaning of exam room while observing appropriate contact time as indicated for disinfecting solutions.   Subjective:   Max Duffy is a 25 y.o. very pleasant male patient with Body mass index is 37.41 kg/m. who presents with the following:  Throat is really sore.  Hurts to swallor at night.  Will wake up most of the night, and he will hardly beathe. Throat will get dry fast.  Was on a cruise.  Coughing up a lot of mucous.   Will do a lot after night.   No fever, chill, body aches.  Wed night did wake up with some cold.  He has had some congestion, but it is not that bad  Dayquil and nyquil.  Review of Systems is noted in the HPI, as appropriate  Objective:   BP 104/80   Pulse 85   Temp 98.3 F (36.8 C) (Temporal)   Ht 5' 7.75" (1.721 m)   Wt 244 lb 4 oz (110.8 kg)   SpO2 98%   BMI 37.41 kg/m    Gen: WDWN, NAD. Globally Non-toxic HEENT: Throat clear, w/o exudate, R TM clear, L TM - good landmarks, No fluid present. rhinnorhea.  MMM Frontal sinuses: NT Max sinuses: NT NECK: Anterior cervical  LAD is absent CV: RRR, No M/G/R, cap refill <2 sec PULM: Breathing comfortably in no respiratory distress. no wheezing, crackles, rhonchi   Laboratory and Imaging Data: Results for orders placed  or performed in visit on 10/08/21  POCT rapid strep A  Result Value Ref Range   Rapid Strep A Screen Negative Negative     Assessment and Plan:     ICD-10-CM   1. Pharyngitis due to other organism  J02.8 POCT rapid strep A    2. Acute cough  R05.1      Supportive care  Patient Instructions  SORE THROAT -Most are viral injections.  -Strep throat is bacterial and requires antibiotics -Drainage and cough can irritate throat  TREATMENT 1. Warm liquids, salt water gargles to help with the sore throat. 2. Chloraseptic as needed can help a lot. YOU CANNOT OVERDOSE ON CHLORASEPTIC. I PERSONALLY USE IT ABOUT EVERY 20 MINUTES WITH A SORE THROAT. 3. Cough drops, popsicles, or hard candy 4. Liquids - drink plenty, without caffeine 5. Salt water gargle: 1/2 tsp salt in 1/2 glass warm water 6. Avoid spicy food 7. Get plenty of sleep 8. Ice chips for comfort     No orders of the defined types were placed in this encounter.  Medications Discontinued During This Encounter  Medication Reason   cyproheptadine (PERIACTIN) 4 MG tablet Completed Course   ketoconazole (NIZORAL) 2 % cream Duplicate   Orders Placed This Encounter  Procedures   POCT rapid strep A    Follow-up: No follow-ups on file.  Nurse, children's  [employer] speech-to-text software was used for transcription in this dictation.  Possible transcriptional errors can occur using Animal nutritionist.   Signed,  Elpidio Galea. Ciel Yanes, MD   Outpatient Encounter Medications as of 10/08/2021  Medication Sig   aspirin-acetaminophen-caffeine (EXCEDRIN MIGRAINE) 250-250-65 MG tablet Take 2 tablets by mouth every 6 (six) hours as needed for headache.   cetirizine (ZYRTEC ALLERGY) 10 MG tablet Take 1 tablet (10 mg total) by mouth daily.   fluticasone (FLONASE) 50 MCG/ACT nasal spray Place 1 spray into both nostrils daily.   ketoconazole (NIZORAL) 2 % cream Apply 1 application topically daily as needed for irritation.   mupirocin ointment  (BACTROBAN) 2 % Apply 1 application topically 2 (two) times daily. To affected area   ondansetron (ZOFRAN) 4 MG tablet Take 1 tablet (4 mg total) by mouth every 8 (eight) hours as needed.   SUMAtriptan (IMITREX) 50 MG tablet TAKE 1 TABLET BY MOUTH ONCE AS NEEDED FOR MIGRAINE. MAY REPEAT IN 2 HOURS IF HEADACHE PERSISTS   triamcinolone (NASACORT) 55 MCG/ACT AERO nasal inhaler Place 2 sprays into the nose daily.   [DISCONTINUED] ketoconazole (NIZORAL) 2 % cream APPLY 1 APPLICATION TOPICALLY DAILY. TO AFFECTED AREAS AFTER CLEANING AND DRY AREAS WELL (Patient taking differently: Apply 1 application topically daily as needed. To affected areas after cleaning and dry areas well)   [DISCONTINUED] cyproheptadine (PERIACTIN) 4 MG tablet Take a half of tablet at bedtime   No facility-administered encounter medications on file as of 10/08/2021.

## 2022-05-19 ENCOUNTER — Ambulatory Visit
Admission: EM | Admit: 2022-05-19 | Discharge: 2022-05-19 | Disposition: A | Discharge status: Home / Self Care | Payer: Managed Care, Other (non HMO) | Attending: Emergency Medicine | Admitting: Emergency Medicine

## 2022-05-19 DIAGNOSIS — B349 Viral infection, unspecified: Secondary | ICD-10-CM

## 2022-05-19 LAB — POCT RAPID STREP A (OFFICE): Rapid Strep A Screen: NEGATIVE

## 2022-05-19 NOTE — ED Triage Notes (Signed)
Patient presents to Urgent Care with complaints of sore throat, chills, body aches, fever, and fatigue x 2 days. Treating with ibuprofen and sudafed.

## 2022-05-19 NOTE — ED Provider Notes (Signed)
[facility]    CSN: [id number] Arrival date & time: 05/19/22  1353      History   Chief Complaint Chief Complaint  Patient presents with   Fever    Entered by patient   Sore Throat   Fatigue   Chills   Generalized Body Aches    HPI Max Duffy is a 26 y.o. male.  Patient presents with fever, chills, fatigue, body aches, sore throat x2 days.  No rash, cough, shortness of breath, vomiting, diarrhea, or other symptoms.  Treatment at home with ibuprofen and Sudafed.  His medical history includes migraine headaches, chronic sinusitis, allergic rhinitis, obstructive sleep apnea.  The history is provided by the patient and medical records.    Past Medical History:  Diagnosis Date   History of headache     Patient Active Problem List   Diagnosis Date Noted   Scab 07/10/2021   Food intolerance 04/27/2021   Family history of colonic polyps 11/15/2020   Folliculitis 03/03/2020   Poison ivy dermatitis 04/21/2018   Jock itch 08/19/2017   Chronic nasal congestion 01/15/2017   OSA (obstructive sleep apnea) 07/28/2015   Excessive daytime sleepiness 06/19/2015   Cognitive deficits 06/19/2015   Somnolence, daytime 06/07/2015   Snoring 06/07/2015   Allergic rhinitis 06/07/2015   Stress reaction 06/07/2015   Constipation 04/21/2015   Screening for HIV (human immunodeficiency virus) 04/21/2015   Obesity 04/21/2015   Elevated transaminase level 12/18/2014   Sinusitis, chronic 11/15/2014   Migraine 11/15/2014   Routine general medical examination at a health care facility 11/15/2014    Past Surgical History:  Procedure Laterality Date   KNEE SURGERY  2010   SINUS IRRIGATION     age 65       Home Medications    Prior to Admission medications   Medication Sig Start Date End Date Taking? Authorizing Provider  aspirin-acetaminophen-caffeine (EXCEDRIN MIGRAINE) [phone] MG tablet Take 2 tablets by mouth every 6 (six) hours as needed for headache.     [provider]  cetirizine (ZYRTEC ALLERGY) 10 MG tablet Take 1 tablet (10 mg total) by mouth daily. 07/24/21   Kozlow, Alvira Philips, MD  fluticasone (FLONASE) 50 MCG/ACT nasal spray Place 1 spray into both nostrils daily. 08/16/20   Hall-Potvin, [place], PA-C  ketoconazole (NIZORAL) 2 % cream Apply 1 application topically daily as needed for irritation.    [provider]  mupirocin ointment (BACTROBAN) 2 % Apply 1 application topically 2 (two) times daily. To affected area 07/10/21   Tower, Audrie Gallus, MD  ondansetron (ZOFRAN) 4 MG tablet Take 1 tablet (4 mg total) by mouth every 8 (eight) hours as needed. 12/24/18   Lorre Munroe, NP  SUMAtriptan (IMITREX) 50 MG tablet TAKE 1 TABLET BY MOUTH ONCE AS NEEDED FOR MIGRAINE. MAY REPEAT IN 2 HOURS IF HEADACHE PERSISTS 04/16/19   Tower, Audrie Gallus, MD  triamcinolone (NASACORT) 55 MCG/ACT AERO nasal inhaler Place 2 sprays into the nose daily. 07/24/21   Kozlow, Alvira Philips, MD    Family History Family History  Problem Relation Age of Onset   Hypertension Maternal Grandfather    Diabetes Paternal Grandfather    Hypertension Maternal Uncle    Diabetes Father     Social History Social History   Tobacco Use   Smoking status: Former    Types: Cigarettes    Quit date: 11/04/2012    Years since quitting: 9.5   Smokeless tobacco: Never  Vaping Use   Vaping Use:  Former  Substance Use Topics   Alcohol use: Yes    Alcohol/week: 0.0 standard drinks of alcohol    Comment: occ/rare   Drug use: No     Allergies   Patient has no known allergies.   Review of Systems Review of Systems  Constitutional:  Positive for chills, fatigue and fever.  HENT:  Positive for sore throat. Negative for ear pain.   Respiratory:  Negative for cough and shortness of breath.   Gastrointestinal:  Negative for diarrhea and vomiting.  Skin:  Negative for color change and rash.  All other systems reviewed and are negative.    Physical Exam Triage Vital  Signs ED Triage Vitals [05/19/22 1512]  Enc Vitals Group     BP (!) 145/91     Pulse Rate (!) 107     Resp 18     Temp 99 F (37.2 C)     Temp src      SpO2 98 %     Weight      Height      Head Circumference      Peak Flow      Pain Score      Pain Loc      Pain Edu?      Excl. in GC?    No data found.  Updated Vital Signs BP (!) 141/87 (BP Location: Left Arm)   Pulse 96   Temp 99 F (37.2 C)   Resp 18   SpO2 97%   Visual Acuity Right Eye Distance:   Left Eye Distance:   Bilateral Distance:    Right Eye Near:   Left Eye Near:    Bilateral Near:     Physical Exam Vitals and nursing note reviewed.  Constitutional:      General: He is not in acute distress.    Appearance: Normal appearance. He is well-developed. He is not ill-appearing.  HENT:     Right Ear: Tympanic membrane normal.     Left Ear: Tympanic membrane normal.     Nose: Nose normal.     Mouth/Throat:     Mouth: Mucous membranes are moist.     Pharynx: Oropharynx is clear.  Cardiovascular:     Rate and Rhythm: Normal rate and regular rhythm.     Heart sounds: Normal heart sounds.  Pulmonary:     Effort: Pulmonary effort is normal. No respiratory distress.     Breath sounds: Normal breath sounds.  Musculoskeletal:     Cervical back: Neck supple.  Skin:    General: Skin is warm and dry.  Neurological:     Mental Status: He is alert.  Psychiatric:        Mood and Affect: Mood normal.        Behavior: Behavior normal.      UC Treatments / Results  Labs (all labs ordered are listed, but only abnormal results are displayed) Labs Reviewed  NOVEL CORONAVIRUS, NAA  POCT RAPID STREP A (OFFICE)    EKG   Radiology No results found.  Procedures Procedures (including critical care time)  Medications Ordered in UC Medications - No data to display  Initial Impression / Assessment and Plan / UC Course  I have reviewed the triage vital signs and the nursing notes.  Pertinent labs &  imaging results that were available during my care of the patient were reviewed by me and considered in my medical decision making (see chart for details).  Viral illness.  Rapid strep  negative.  COVID pending.  Discussed symptomatic treatment including Tylenol or ibuprofen, rest, hydration.  Instructed patient to follow up with PCP if symptoms are not improving.  He agrees to plan of care.    Final Clinical Impressions(s) / UC Diagnoses   Final diagnoses:  Viral illness     Discharge Instructions      The strep test is negative.  The COVID test is pending.     Take Tylenol or ibuprofen as needed for fever or discomfort.  Rest and keep yourself hydrated.    Follow-up with your primary care provider if your symptoms are not improving.         ED Prescriptions   None    PDMP not reviewed this encounter.   Mickie Bail, NP 05/19/22 1537

## 2022-05-19 NOTE — Discharge Instructions (Addendum)
The strep test is negative.  The COVID test is pending.     Take Tylenol or ibuprofen as needed for fever or discomfort.  Rest and keep yourself hydrated.    Follow-up with your primary care provider if your symptoms are not improving.

## 2022-05-20 LAB — NOVEL CORONAVIRUS, NAA: SARS-CoV-2, NAA: NOT DETECTED

## 2022-05-22 ENCOUNTER — Telehealth: Payer: Self-pay | Admitting: Family Medicine

## 2022-05-22 ENCOUNTER — Encounter: Payer: Self-pay | Admitting: Family Medicine

## 2022-05-22 NOTE — Telephone Encounter (Signed)
Called and spoke with pt , advised that Dr. Milinda Antis is out of the office on Friday and doesn't have any opening today or tomorrow. That he needs to keep the appointment that  he has with [facility] in morning at 930am

## 2022-05-22 NOTE — Telephone Encounter (Signed)
Please schedule appt with first available in office, thanks

## 2022-05-22 NOTE — Telephone Encounter (Signed)
Patient call and stated that he has been having some symptoms some fatigue, sore throat, night sweats. He is scheduled to See Audria Nine 05/24/2022 at  9:40 am. Call back number [phone]

## 2022-05-23 ENCOUNTER — Ambulatory Visit: Payer: Managed Care, Other (non HMO) | Admitting: Nurse Practitioner

## 2022-05-24 ENCOUNTER — Ambulatory Visit (INDEPENDENT_AMBULATORY_CARE_PROVIDER_SITE_OTHER): Payer: Managed Care, Other (non HMO) | Admitting: Nurse Practitioner

## 2022-05-24 VITALS — BP 128/90 | HR 70 | Temp 96.2°F | Resp 14 | Ht 67.75 in | Wt 228.0 lb

## 2022-05-24 DIAGNOSIS — J029 Acute pharyngitis, unspecified: Secondary | ICD-10-CM | POA: Diagnosis not present

## 2022-05-24 DIAGNOSIS — J020 Streptococcal pharyngitis: Secondary | ICD-10-CM | POA: Insufficient documentation

## 2022-05-24 DIAGNOSIS — J358 Other chronic diseases of tonsils and adenoids: Secondary | ICD-10-CM | POA: Diagnosis not present

## 2022-05-24 DIAGNOSIS — R5383 Other fatigue: Secondary | ICD-10-CM | POA: Diagnosis not present

## 2022-05-24 LAB — POCT RAPID STREP A (OFFICE): Rapid Strep A Screen: POSITIVE — AB

## 2022-05-24 MED ORDER — METHYLPREDNISOLONE ACETATE 40 MG/ML IJ SUSP
40.0000 mg | Freq: Once | INTRAMUSCULAR | Status: AC
  Administered 2022-05-24: 40 mg via INTRAMUSCULAR

## 2022-05-24 MED ORDER — AMOXICILLIN 500 MG PO CAPS: 500.0000 mg | ORAL_CAPSULE | Freq: Two times a day (BID) | ORAL | 0 refills | Status: AC

## 2022-05-24 NOTE — Progress Notes (Signed)
Acute Office Visit  Subjective:     Patient ID: Max Duffy, male    DOB: [birth year], 26 y.o.   MRN: [id number]  Chief Complaint  Patient presents with   Sore Throat    Sx started on 05/17/22 and was seen at Urgent Care 05/19/22-had fever, chills, sore throat. Rapid strep and Covid test was negative. Still has feeling of swelling of the left side of the neck. Woke up yesterday choking -could feel his uvula touching his side of the mouth/neck and it made him cough and choke. His snoring got worse the last couple days. Has been waking up with night sweats. No fever anymore since 05/19/22.     Patient is in today for Sore throat  States that symptoms started on 05/17/2022 and was seen at urgent care on 05/19/2022 and rapid strep and covid was negative.  States that he stoped having the night sweats this past Tuesday. States that the fever broke this past Sunday. States that he has been getting his energy back States yesterday that he woke up gagging and states that the left side of his neck was swollen and felt that the uvula was touching his throat that made him gag  States that the soreness of the throat has almost resolved but terms as swollen. Did use ibuprofen that helped and took excedrin for a headache last night.     Review of Systems  Constitutional:  Positive for malaise/fatigue. Negative for chills and fever.       Appetite is coming back to normal   HENT:  Positive for ear pain and sore throat. Negative for ear discharge and sinus pain.   Respiratory:  Negative for cough.   Musculoskeletal:  Negative for joint pain and myalgias.  Neurological:  Positive for headaches.        Objective:    BP 128/90   Pulse 70   Temp (!) 96.2 F (35.7 C) (Temporal)   Resp 14   Ht 5' 7.75" (1.721 m)   Wt 228 lb (103.4 kg)   SpO2 97%   BMI 34.92 kg/m    Physical Exam Vitals and nursing note reviewed.  Constitutional:      Appearance: He is well-developed.  HENT:      Right Ear: Tympanic membrane, ear canal and external ear normal.     Left Ear: Tympanic membrane, ear canal and external ear normal.     Mouth/Throat:     Mouth: Mucous membranes are moist.     Pharynx: Oropharyngeal exudate and posterior oropharyngeal erythema present.  Neck:     Thyroid: No thyromegaly.  Cardiovascular:     Rate and Rhythm: Normal rate and regular rhythm.     Heart sounds: Normal heart sounds.  Pulmonary:     Effort: Pulmonary effort is normal.     Breath sounds: Normal breath sounds.  Lymphadenopathy:     Cervical: Cervical adenopathy present.  Neurological:     Mental Status: He is alert.     Results for orders placed or performed in visit on 05/24/22  Rapid Strep A  Result Value Ref Range   Rapid Strep A Screen Positive (A) Negative        Assessment & Plan:   Problem List Items Addressed This Visit       Respiratory   Strep throat    Strep test positive in office.  Treat with amoxicillin 500 mg twice daily for 10 days.  Patient also had what he felt like  was swelling in the back of her throat.  Will administer Depo-Medrol 40 mg IM x1 dose in office        Other   Sore throat - Primary    Continue using over-the-counter treatments to help with sore throat.  Strep test in office      Relevant Medications   methylPREDNISolone acetate (DEPO-MEDROL) injection 40 mg   amoxicillin (AMOXIL) 500 MG capsule   Other Relevant Orders   Rapid Strep A (Completed)   Tonsillar exudate    Strep test in office.  Came back positive treat with amoxicillin 500 mg twice daily for 10 days.      Relevant Orders   Rapid Strep A (Completed)   Other fatigue    Meds ordered this encounter  Medications   methylPREDNISolone acetate (DEPO-MEDROL) injection 40 mg   amoxicillin (AMOXIL) 500 MG capsule    Sig: Take 1 capsule (500 mg total) by mouth 2 (two) times daily for 10 days.    Dispense:  20 capsule    Refill:  0    Order Specific Question:   Supervising  Provider    Answer:   Roxy Manns A [[id number]]    Return if symptoms worsen or fail to improve, for make appt for CPE with Dr. Milinda Antis.  Audria Nine, NP

## 2022-05-24 NOTE — Assessment & Plan Note (Signed)
Strep test in office.  Came back positive treat with amoxicillin 500 mg twice daily for 10 days.

## 2022-05-24 NOTE — Assessment & Plan Note (Signed)
Strep test positive in office.  Treat with amoxicillin 500 mg twice daily for 10 days.  Patient also had what he felt like was swelling in the back of her throat.  Will administer Depo-Medrol 40 mg IM x1 dose in office

## 2022-05-24 NOTE — Assessment & Plan Note (Signed)
Continue using over-the-counter treatments to help with sore throat.  Strep test in office

## 2022-05-24 NOTE — Patient Instructions (Signed)
There was a faint line on the test and given your symptoms and exam we will treat you  Take the entire course of the antibiotics.  Make an appointment for your physical with Dr. Milinda Antis

## 2022-06-04 ENCOUNTER — Other Ambulatory Visit: Payer: Self-pay

## 2022-06-04 ENCOUNTER — Ambulatory Visit
Admission: EM | Admit: 2022-06-04 | Discharge: 2022-06-04 | Disposition: A | Discharge status: Home / Self Care | Payer: Managed Care, Other (non HMO) | Attending: Emergency Medicine | Admitting: Emergency Medicine

## 2022-06-04 ENCOUNTER — Telehealth: Payer: Self-pay | Admitting: Family Medicine

## 2022-06-04 ENCOUNTER — Encounter: Payer: Self-pay | Admitting: Family Medicine

## 2022-06-04 DIAGNOSIS — R21 Rash and other nonspecific skin eruption: Secondary | ICD-10-CM

## 2022-06-04 MED ORDER — DOXYCYCLINE HYCLATE 100 MG PO CAPS: 100.0000 mg | ORAL_CAPSULE | Freq: Two times a day (BID) | ORAL | 0 refills | Status: DC

## 2022-06-04 MED ORDER — PREDNISONE 10 MG (21) PO TBPK: ORAL_TABLET | Freq: Every day | ORAL | 0 refills | Status: DC

## 2022-06-04 NOTE — ED Provider Notes (Signed)
Renaldo Fiddler    CSN: [id number] Arrival date & time: 06/04/22  1731      History   Chief Complaint Chief Complaint  Patient presents with   Rash    HPI Max Duffy is a 26 y.o. male.   Patient presents with a rash to the right forearm beginning 5 days ago from unknown cause.  Rash is erythematous and pruritic .initially thought it was poison ivy however there is a perfectly circular lesion associated with the rash that is causing concern.  Has attempted use of a topical steroid cream which has been minimally effective.  Denies changes in soaps, lotions detergents, dietary changes or recent travel.  Currently being treated for strep, last dose of amoxicillin today.  Denies fever, chills or drainage.  No other members of household has similar symptoms.  Past Medical History:  Diagnosis Date   History of headache     Patient Active Problem List   Diagnosis Date Noted   Strep throat 05/24/2022   Sore throat 05/24/2022   Tonsillar exudate 05/24/2022   Other fatigue 05/24/2022   Scab 07/10/2021   Food intolerance 04/27/2021   Family history of colonic polyps 11/15/2020   Folliculitis 03/03/2020   Poison ivy dermatitis 04/21/2018   Jock itch 08/19/2017   Chronic nasal congestion 01/15/2017   OSA (obstructive sleep apnea) 07/28/2015   Excessive daytime sleepiness 06/19/2015   Cognitive deficits 06/19/2015   Somnolence, daytime 06/07/2015   Snoring 06/07/2015   Allergic rhinitis 06/07/2015   Stress reaction 06/07/2015   Constipation 04/21/2015   Screening for HIV (human immunodeficiency virus) 04/21/2015   Obesity 04/21/2015   Elevated transaminase level 12/18/2014   Sinusitis, chronic 11/15/2014   Migraine 11/15/2014   Routine general medical examination at a health care facility 11/15/2014    Past Surgical History:  Procedure Laterality Date   KNEE SURGERY  2010   SINUS IRRIGATION     age 31       Home Medications    Prior to Admission  medications   Medication Sig Start Date End Date Taking? Authorizing Provider  doxycycline (VIBRAMYCIN) 100 MG capsule Take 1 capsule (100 mg total) by mouth 2 (two) times daily. 06/04/22  Yes Giovani Neumeister R, NP  predniSONE (STERAPRED UNI-PAK 21 TAB) 10 MG (21) TBPK tablet Take by mouth daily. Take 6 tabs by mouth daily  for 2 days, then 5 tabs for 2 days, then 4 tabs for 2 days, then 3 tabs for 2 days, 2 tabs for 2 days, then 1 tab by mouth daily for 2 days 06/04/22  Yes Valinda Hoar, NP  aspirin-acetaminophen-caffeine (EXCEDRIN MIGRAINE) [phone] MG tablet Take 2 tablets by mouth every 6 (six) hours as needed for headache.    [provider]  cetirizine (ZYRTEC ALLERGY) 10 MG tablet Take 1 tablet (10 mg total) by mouth daily. 07/24/21   Kozlow, Alvira Philips, MD  fluticasone (FLONASE) 50 MCG/ACT nasal spray Place 1 spray into both nostrils daily. 08/16/20   Hall-Potvin, [place], PA-C  ketoconazole (NIZORAL) 2 % cream Apply 1 application topically daily as needed for irritation.    [provider]  mupirocin ointment (BACTROBAN) 2 % Apply 1 application topically 2 (two) times daily. To affected area 07/10/21   Tower, Audrie Gallus, MD  ondansetron (ZOFRAN) 4 MG tablet Take 1 tablet (4 mg total) by mouth every 8 (eight) hours as needed. 12/24/18   Lorre Munroe, NP  SUMAtriptan (IMITREX) 50 MG tablet TAKE 1  TABLET BY MOUTH ONCE AS NEEDED FOR MIGRAINE. MAY REPEAT IN 2 HOURS IF HEADACHE PERSISTS 04/16/19   Tower, Audrie Gallus, MD    Family History Family History  Problem Relation Age of Onset   Hypertension Maternal Grandfather    Diabetes Paternal Grandfather    Hypertension Maternal Uncle    Diabetes Father     Social History Social History   Tobacco Use   Smoking status: Former    Types: Cigarettes    Quit date: 11/04/2012    Years since quitting: 9.5   Smokeless tobacco: Never  Vaping Use   Vaping Use: Former  Substance Use Topics   Alcohol use: Yes    Alcohol/week: 0.0  standard drinks of alcohol    Comment: occ/rare   Drug use: No     Allergies   Patient has no known allergies.   Review of Systems Review of Systems  Constitutional: Negative.   Respiratory: Negative.    Cardiovascular: Negative.   Skin:  Positive for rash. Negative for color change, pallor and wound.  Neurological: Negative.      Physical Exam Triage Vital Signs ED Triage Vitals  Enc Vitals Group     BP 06/04/22 1751 114/81     Pulse Rate 06/04/22 1751 66     Resp 06/04/22 1751 18     Temp 06/04/22 1751 98.4 F (36.9 C)     Temp Source 06/04/22 1751 Oral     SpO2 06/04/22 1751 98 %     Weight --      Height --      Head Circumference --      Peak Flow --      Pain Score 06/04/22 1749 0     Pain Loc --      Pain Edu? --      Excl. in GC? --    No data found.  Updated Vital Signs BP 114/81 (BP Location: Left Arm)   Pulse 66   Temp 98.4 F (36.9 C) (Oral)   Resp 18   SpO2 98%   Visual Acuity Right Eye Distance:   Left Eye Distance:   Bilateral Distance:    Right Eye Near:   Left Eye Near:    Bilateral Near:     Physical Exam Constitutional:      Appearance: Normal appearance.  HENT:     Head: Normocephalic.  Eyes:     Extraocular Movements: Extraocular movements intact.  Pulmonary:     Effort: Pulmonary effort is normal.  Skin:    Comments: Defer to photo below  Neurological:     Mental Status: He is alert and oriented to person, place, and time. Mental status is at baseline.  Psychiatric:        Mood and Affect: Mood normal.        Behavior: Behavior normal.       UC Treatments / Results  Labs (all labs ordered are listed, but only abnormal results are displayed) Labs Reviewed - No data to display  EKG   Radiology No results found.  Procedures Procedures (including critical care time)  Medications Ordered in UC Medications - No data to display  Initial Impression / Assessment and Plan / UC Course  I have reviewed the  triage vital signs and the nursing notes.  Pertinent labs & imaging results that were available during my care of the patient were reviewed by me and considered in my medical decision making (see chart for details).  Rash  Unknown etiology, appears to be inflammatory dermatitis, circular lesion is not consistent with fungus nor bull's-eye rash, discussed with patient, requesting blood work however will defer as I do not feel at this time it will change the course of action, discussed with patient, prednisone 60 mg taper as well as doxycycline prescribed we will coverage, may use oral or topical antihistamines for management of pruritus, given strict precautions that if no improvement seen within 72 hours of medication use he is to follow-up with urgent care or PCP for reevaluation of symptoms Final Clinical Impressions(s) / UC Diagnoses   Final diagnoses:  Rash and nonspecific skin eruption     Discharge Instructions      Today you are being treated to clear rash, the cause of your rash is unknown at this time however based on presentation it appears to be a inflammatory process of the skin and is not consistent with more common rashes   Starting tomorrow take prednisone every morning with food as directed, this medicine will help reduce the inflammatory process that occurs with reaction in the aid in clearing your symptoms  Take doxycycline every morning and every evening for the next 7 days, this will provide bacterial coverage for the skin  At this time I do not believe it would be beneficial to obtain lab work as it will not change the course of treatment, the presentation of your rash is not consistent with bruising therefore lab work to look at your blood levels is not warranted, your rash is not consistent with the bull's-eye presentation of Lyme disease and there were I do not believe that titers are necessary at this time  You may use of topical calamine or Benadryl cream to help  manage itching, you may also use oral Benadryl, Claritin or Zyrtec  Please avoid long exposures to heat such as a hot steamy shower or being outside as this may cause further irritation to your rash  You may follow-up with his urgent care as needed if symptoms persist or worsen    ED Prescriptions     Medication Sig Dispense Auth. Provider   predniSONE (STERAPRED UNI-PAK 21 TAB) 10 MG (21) TBPK tablet Take by mouth daily. Take 6 tabs by mouth daily  for 2 days, then 5 tabs for 2 days, then 4 tabs for 2 days, then 3 tabs for 2 days, 2 tabs for 2 days, then 1 tab by mouth daily for 2 days 42 tablet Max Duffy R, NP   doxycycline (VIBRAMYCIN) 100 MG capsule Take 1 capsule (100 mg total) by mouth 2 (two) times daily. 14 capsule Hermila Millis, Elita Boone, NP      PDMP not reviewed this encounter.   Valinda Hoar, NP 06/04/22 1843

## 2022-06-04 NOTE — Discharge Instructions (Signed)
Today you are being treated to clear rash, the cause of your rash is unknown at this time however based on presentation it appears to be a inflammatory process of the skin and is not consistent with more common rashes   Starting tomorrow take prednisone every morning with food as directed, this medicine will help reduce the inflammatory process that occurs with reaction in the aid in clearing your symptoms  Take doxycycline every morning and every evening for the next 7 days, this will provide bacterial coverage for the skin  At this time I do not believe it would be beneficial to obtain lab work as it will not change the course of treatment, the presentation of your rash is not consistent with bruising therefore lab work to look at your blood levels is not warranted, your rash is not consistent with the bull's-eye presentation of Lyme disease and there were I do not believe that titers are necessary at this time  You may use of topical calamine or Benadryl cream to help manage itching, you may also use oral Benadryl, Claritin or Zyrtec  Please avoid long exposures to heat such as a hot steamy shower or being outside as this may cause further irritation to your rash  You may follow-up with his urgent care as needed if symptoms persist or worsen

## 2022-06-04 NOTE — Telephone Encounter (Signed)
Patient called and said that he has to have a physical done for insurance and has form that needs to be filled out and the deadline is 07/03/22. Your next available CPE isnt until 07/03/22 which is to late. Can patient be worked in sooner? Call back is [phone]

## 2022-06-04 NOTE — ED Triage Notes (Addendum)
Noticed rash on Friday.rash is on right forearm.  Right forearm has perfectly round area of redness and a larger, irregular shape of rash around this.  Area does not itch .  Has used a prescribed cream.  Patient will take last dose of amoxicillin today-being treated for strep

## 2022-06-04 NOTE — Telephone Encounter (Signed)
Try the 06/25/22 @11am 

## 2022-06-25 ENCOUNTER — Encounter: Payer: Managed Care, Other (non HMO) | Admitting: Family Medicine

## 2023-10-13 ENCOUNTER — Ambulatory Visit
Admission: EM | Admit: 2023-10-13 | Discharge: 2023-10-13 | Disposition: A | Discharge status: Home / Self Care | Payer: BC Managed Care – PPO | Attending: Emergency Medicine | Admitting: Emergency Medicine

## 2023-10-13 DIAGNOSIS — J069 Acute upper respiratory infection, unspecified: Secondary | ICD-10-CM

## 2023-10-13 MED ORDER — AZITHROMYCIN 250 MG PO TABS: 250.0000 mg | ORAL_TABLET | Freq: Every day | ORAL | 0 refills | Status: DC

## 2023-10-13 NOTE — ED Triage Notes (Signed)
Patient to Urgent Care with complaints of cough.  Symptoms started Friday. Reports poor sleep due to coughing. Cough is productive. Denies any known fevers.  Pushing fluids/ Taking nyquil.

## 2023-10-13 NOTE — ED Provider Notes (Signed)
Renaldo Fiddler    CSN: [id number] Arrival date & time: 10/13/23  1115      History   Chief Complaint Chief Complaint  Patient presents with   Cough   Nasal Congestion    HPI Max Duffy is a 27 y.o. male.  Patient presents with 4 to 5-day history of congestion and cough.  His cough is productive of yellow sputum.  Treatment attempted with NyQuil; no OTC medications taken today.  He denies fever, chest pain, shortness of breath, or other symptoms.  The history is provided by the patient and medical records.    Past Medical History:  Diagnosis Date   History of headache     Patient Active Problem List   Diagnosis Date Noted   Strep throat 05/24/2022   Sore throat 05/24/2022   Tonsillar exudate 05/24/2022   Other fatigue 05/24/2022   Scab 07/10/2021   Food intolerance 04/27/2021   Family history of colonic polyps 11/15/2020   Folliculitis 03/03/2020   Poison ivy dermatitis 04/21/2018   Jock itch 08/19/2017   Chronic nasal congestion 01/15/2017   OSA (obstructive sleep apnea) 07/28/2015   Excessive daytime sleepiness 06/19/2015   Cognitive deficits 06/19/2015   Somnolence, daytime 06/07/2015   Snoring 06/07/2015   Allergic rhinitis 06/07/2015   Stress reaction 06/07/2015   Constipation 04/21/2015   Screening for HIV (human immunodeficiency virus) 04/21/2015   Obesity 04/21/2015   Elevated transaminase level 12/18/2014   Sinusitis, chronic 11/15/2014   Migraine 11/15/2014   Routine general medical examination at a health care facility 11/15/2014    Past Surgical History:  Procedure Laterality Date   KNEE SURGERY  2010   SINUS IRRIGATION     age 4       Home Medications    Prior to Admission medications   Medication Sig Start Date End Date Taking? Authorizing Provider  azithromycin (ZITHROMAX) 250 MG tablet Take 1 tablet (250 mg total) by mouth daily. Take first 2 tablets together, then 1 every day until finished. 10/13/23  Yes Mickie Bail, NP  aspirin-acetaminophen-caffeine (EXCEDRIN MIGRAINE) [phone] MG tablet Take 2 tablets by mouth every 6 (six) hours as needed for headache.    [provider]  cetirizine (ZYRTEC ALLERGY) 10 MG tablet Take 1 tablet (10 mg total) by mouth daily. 07/24/21   Kozlow, Alvira Philips, MD  doxycycline (VIBRAMYCIN) 100 MG capsule Take 1 capsule (100 mg total) by mouth 2 (two) times daily. Patient not taking: Reported on 10/13/2023 06/04/22   Valinda Hoar, NP  fluticasone (FLONASE) 50 MCG/ACT nasal spray Place 1 spray into both nostrils daily. 08/16/20   Hall-Potvin, [place], PA-C  ketoconazole (NIZORAL) 2 % cream Apply 1 application topically daily as needed for irritation. Patient not taking: Reported on 10/13/2023    [provider]  mupirocin ointment (BACTROBAN) 2 % Apply 1 application topically 2 (two) times daily. To affected area Patient not taking: Reported on 10/13/2023 07/10/21   Tower, Audrie Gallus, MD  ondansetron (ZOFRAN) 4 MG tablet Take 1 tablet (4 mg total) by mouth every 8 (eight) hours as needed. 12/24/18   Lorre Munroe, NP  predniSONE (STERAPRED UNI-PAK 21 TAB) 10 MG (21) TBPK tablet Take by mouth daily. Take 6 tabs by mouth daily  for 2 days, then 5 tabs for 2 days, then 4 tabs for 2 days, then 3 tabs for 2 days, 2 tabs for 2 days, then 1 tab by mouth daily for 2 days Patient not taking: Reported  on 10/13/2023 06/04/22   Valinda Hoar, NP  SUMAtriptan (IMITREX) 50 MG tablet TAKE 1 TABLET BY MOUTH ONCE AS NEEDED FOR MIGRAINE. MAY REPEAT IN 2 HOURS IF HEADACHE PERSISTS 04/16/19   Tower, Audrie Gallus, MD    Family History Family History  Problem Relation Age of Onset   Hypertension Maternal Grandfather    Diabetes Paternal Grandfather    Hypertension Maternal Uncle    Diabetes Father     Social History Social History   Tobacco Use   Smoking status: Former    Current packs/day: 0.00    Types: Cigarettes    Quit date: 11/04/2012    Years since quitting: 10.9   Smokeless  tobacco: Never  Vaping Use   Vaping status: Former  Substance Use Topics   Alcohol use: Yes    Alcohol/week: 0.0 standard drinks of alcohol    Comment: occ/rare   Drug use: No     Allergies   Patient has no known allergies.   Review of Systems Review of Systems  Constitutional:  Negative for chills and fever.  HENT:  Positive for congestion, postnasal drip and rhinorrhea. Negative for ear pain and sore throat.   Respiratory:  Positive for cough. Negative for shortness of breath.   Cardiovascular:  Negative for chest pain and palpitations.     Physical Exam Triage Vital Signs ED Triage Vitals  Encounter Vitals Group     BP 10/13/23 1145 129/83     Systolic BP Percentile --      Diastolic BP Percentile --      Pulse Rate 10/13/23 1145 100     Resp 10/13/23 1145 18     Temp 10/13/23 1145 (!) 97.1 F (36.2 C)     Temp src --      SpO2 10/13/23 1145 98 %     Weight --      Height --      Head Circumference --      Peak Flow --      Pain Score 10/13/23 1144 3     Pain Loc --      Pain Education --      Exclude from Growth Chart --    No data found.  Updated Vital Signs BP 129/83   Pulse 100   Temp (!) 97.1 F (36.2 C)   Resp 18   SpO2 98%   Visual Acuity Right Eye Distance:   Left Eye Distance:   Bilateral Distance:    Right Eye Near:   Left Eye Near:    Bilateral Near:     Physical Exam Constitutional:      General: He is not in acute distress. HENT:     Right Ear: Tympanic membrane normal.     Left Ear: Tympanic membrane normal.     Nose: Congestion present.     Mouth/Throat:     Mouth: Mucous membranes are moist.     Pharynx: Oropharynx is clear.  Cardiovascular:     Rate and Rhythm: Normal rate and regular rhythm.     Heart sounds: Normal heart sounds.  Pulmonary:     Effort: Pulmonary effort is normal. No respiratory distress.     Breath sounds: Normal breath sounds.  Skin:    General: Skin is warm and dry.  Neurological:     Mental  Status: He is alert.      UC Treatments / Results  Labs (all labs ordered are listed, but only abnormal results are displayed) Labs  Reviewed - No data to display  EKG   Radiology No results found.  Procedures Procedures (including critical care time)  Medications Ordered in UC Medications - No data to display  Initial Impression / Assessment and Plan / UC Course  I have reviewed the triage vital signs and the nursing notes.  Pertinent labs & imaging results that were available during my care of the patient were reviewed by me and considered in my medical decision making (see chart for details).    Acute upper respiratory infection.  Patient has been symptomatic for 4 to 5 days.  He is not improving with OTC treatment.  Treating today with Zithromax.  Education provided on URI.  Instructed patient to follow-up with his PCP if he is not improving.  He agrees to plan of care.  Final Clinical Impressions(s) / UC Diagnoses   Final diagnoses:  Acute upper respiratory infection     Discharge Instructions      Take the Zithromax as directed.  Follow-up with your primary care provider if your symptoms are not improving.      ED Prescriptions     Medication Sig Dispense Auth. Provider   azithromycin (ZITHROMAX) 250 MG tablet Take 1 tablet (250 mg total) by mouth daily. Take first 2 tablets together, then 1 every day until finished. 6 tablet Mickie Bail, NP      PDMP not reviewed this encounter.   Mickie Bail, NP 10/13/23 1214

## 2023-10-13 NOTE — Discharge Instructions (Addendum)
Take the Zithromax as directed.  Follow up with your primary care provider if your symptoms are not improving.    

## 2023-11-19 DIAGNOSIS — M9901 Segmental and somatic dysfunction of cervical region: Secondary | ICD-10-CM | POA: Diagnosis not present

## 2023-11-19 DIAGNOSIS — M542 Cervicalgia: Secondary | ICD-10-CM | POA: Diagnosis not present

## 2023-11-19 DIAGNOSIS — M9902 Segmental and somatic dysfunction of thoracic region: Secondary | ICD-10-CM | POA: Diagnosis not present

## 2023-11-19 DIAGNOSIS — M9903 Segmental and somatic dysfunction of lumbar region: Secondary | ICD-10-CM | POA: Diagnosis not present

## 2024-01-07 DIAGNOSIS — M9902 Segmental and somatic dysfunction of thoracic region: Secondary | ICD-10-CM | POA: Diagnosis not present

## 2024-01-07 DIAGNOSIS — M9903 Segmental and somatic dysfunction of lumbar region: Secondary | ICD-10-CM | POA: Diagnosis not present

## 2024-01-07 DIAGNOSIS — M9901 Segmental and somatic dysfunction of cervical region: Secondary | ICD-10-CM | POA: Diagnosis not present

## 2024-01-07 DIAGNOSIS — M542 Cervicalgia: Secondary | ICD-10-CM | POA: Diagnosis not present

## 2024-01-09 DIAGNOSIS — M542 Cervicalgia: Secondary | ICD-10-CM | POA: Diagnosis not present

## 2024-01-09 DIAGNOSIS — M9903 Segmental and somatic dysfunction of lumbar region: Secondary | ICD-10-CM | POA: Diagnosis not present

## 2024-01-09 DIAGNOSIS — M9901 Segmental and somatic dysfunction of cervical region: Secondary | ICD-10-CM | POA: Diagnosis not present

## 2024-01-09 DIAGNOSIS — M9902 Segmental and somatic dysfunction of thoracic region: Secondary | ICD-10-CM | POA: Diagnosis not present

## 2024-01-12 DIAGNOSIS — M9901 Segmental and somatic dysfunction of cervical region: Secondary | ICD-10-CM | POA: Diagnosis not present

## 2024-01-12 DIAGNOSIS — M542 Cervicalgia: Secondary | ICD-10-CM | POA: Diagnosis not present

## 2024-01-12 DIAGNOSIS — M9903 Segmental and somatic dysfunction of lumbar region: Secondary | ICD-10-CM | POA: Diagnosis not present

## 2024-01-12 DIAGNOSIS — M9902 Segmental and somatic dysfunction of thoracic region: Secondary | ICD-10-CM | POA: Diagnosis not present

## 2024-01-14 DIAGNOSIS — M9902 Segmental and somatic dysfunction of thoracic region: Secondary | ICD-10-CM | POA: Diagnosis not present

## 2024-01-14 DIAGNOSIS — M9903 Segmental and somatic dysfunction of lumbar region: Secondary | ICD-10-CM | POA: Diagnosis not present

## 2024-01-14 DIAGNOSIS — M9901 Segmental and somatic dysfunction of cervical region: Secondary | ICD-10-CM | POA: Diagnosis not present

## 2024-01-14 DIAGNOSIS — M542 Cervicalgia: Secondary | ICD-10-CM | POA: Diagnosis not present

## 2024-01-19 DIAGNOSIS — M542 Cervicalgia: Secondary | ICD-10-CM | POA: Diagnosis not present

## 2024-01-19 DIAGNOSIS — M9903 Segmental and somatic dysfunction of lumbar region: Secondary | ICD-10-CM | POA: Diagnosis not present

## 2024-01-19 DIAGNOSIS — M9902 Segmental and somatic dysfunction of thoracic region: Secondary | ICD-10-CM | POA: Diagnosis not present

## 2024-01-19 DIAGNOSIS — M9901 Segmental and somatic dysfunction of cervical region: Secondary | ICD-10-CM | POA: Diagnosis not present

## 2024-01-23 DIAGNOSIS — M9901 Segmental and somatic dysfunction of cervical region: Secondary | ICD-10-CM | POA: Diagnosis not present

## 2024-01-23 DIAGNOSIS — M9903 Segmental and somatic dysfunction of lumbar region: Secondary | ICD-10-CM | POA: Diagnosis not present

## 2024-01-23 DIAGNOSIS — M9902 Segmental and somatic dysfunction of thoracic region: Secondary | ICD-10-CM | POA: Diagnosis not present

## 2024-01-23 DIAGNOSIS — M542 Cervicalgia: Secondary | ICD-10-CM | POA: Diagnosis not present

## 2024-01-26 DIAGNOSIS — M9903 Segmental and somatic dysfunction of lumbar region: Secondary | ICD-10-CM | POA: Diagnosis not present

## 2024-01-26 DIAGNOSIS — M9902 Segmental and somatic dysfunction of thoracic region: Secondary | ICD-10-CM | POA: Diagnosis not present

## 2024-01-26 DIAGNOSIS — M9901 Segmental and somatic dysfunction of cervical region: Secondary | ICD-10-CM | POA: Diagnosis not present

## 2024-01-26 DIAGNOSIS — M542 Cervicalgia: Secondary | ICD-10-CM | POA: Diagnosis not present

## 2024-01-28 DIAGNOSIS — M9901 Segmental and somatic dysfunction of cervical region: Secondary | ICD-10-CM | POA: Diagnosis not present

## 2024-01-28 DIAGNOSIS — M542 Cervicalgia: Secondary | ICD-10-CM | POA: Diagnosis not present

## 2024-01-28 DIAGNOSIS — M9903 Segmental and somatic dysfunction of lumbar region: Secondary | ICD-10-CM | POA: Diagnosis not present

## 2024-01-28 DIAGNOSIS — M9902 Segmental and somatic dysfunction of thoracic region: Secondary | ICD-10-CM | POA: Diagnosis not present

## 2024-01-29 ENCOUNTER — Encounter: Payer: Self-pay | Admitting: Family Medicine

## 2024-01-29 ENCOUNTER — Ambulatory Visit: Admitting: Family Medicine

## 2024-01-29 VITALS — BP 124/76 | HR 71 | Temp 98.1°F | Ht 67.75 in | Wt 237.0 lb

## 2024-01-29 DIAGNOSIS — Z6836 Body mass index (BMI) 36.0-36.9, adult: Secondary | ICD-10-CM

## 2024-01-29 DIAGNOSIS — J309 Allergic rhinitis, unspecified: Secondary | ICD-10-CM | POA: Diagnosis not present

## 2024-01-29 DIAGNOSIS — J329 Chronic sinusitis, unspecified: Secondary | ICD-10-CM

## 2024-01-29 DIAGNOSIS — G43C1 Periodic headache syndromes in child or adult, intractable: Secondary | ICD-10-CM

## 2024-01-29 DIAGNOSIS — R0981 Nasal congestion: Secondary | ICD-10-CM

## 2024-01-29 DIAGNOSIS — E66812 Obesity, class 2: Secondary | ICD-10-CM | POA: Diagnosis not present

## 2024-01-29 DIAGNOSIS — G4733 Obstructive sleep apnea (adult) (pediatric): Secondary | ICD-10-CM

## 2024-01-29 DIAGNOSIS — E6609 Other obesity due to excess calories: Secondary | ICD-10-CM

## 2024-01-29 MED ORDER — CETIRIZINE HCL 10 MG PO TABS: 10.0000 mg | ORAL_TABLET | Freq: Every day | ORAL | 3 refills | Status: AC

## 2024-01-29 MED ORDER — SUMATRIPTAN SUCCINATE 50 MG PO TABS: ORAL_TABLET | ORAL | 5 refills | Status: AC

## 2024-01-29 MED ORDER — FLUTICASONE PROPIONATE 50 MCG/ACT NA SUSP: 2.0000 | Freq: Every day | NASAL | 6 refills | Status: AC

## 2024-01-29 NOTE — Assessment & Plan Note (Signed)
 Triggered by allergies/chronic congestion and also sleep apnea  Intol of cpap/working on weight loss for osa which helped in the past   Sent in cetrizine and flonase with allergies (use flonase with caution in light of past sinus surgery)  Ref to ENT done   Prescription imitrex sent /I suspect it will be more affordable this time but he will let me know   Staying hydrated Working on good sleep habits

## 2024-01-29 NOTE — Assessment & Plan Note (Signed)
 Intol of cpap  Will get back to working on weight loss Not overly tired   Knows this is a migraine trigger

## 2024-01-29 NOTE — Assessment & Plan Note (Signed)
 In setting of chronic congestion/ sinus surgery as a child  Ref to ENT to check in  For the season -sent zyrtec 10 mg and flonase (may need to get over the counter)   Avoid pollen/allergens when able

## 2024-01-29 NOTE — Assessment & Plan Note (Signed)
 Prescription flonase to use with caution  Ref for follow up with ENT

## 2024-01-29 NOTE — Progress Notes (Signed)
 Subjective:    Patient ID: Max Duffy, male    DOB: 11/04/1996, 28 y.o.   MRN: [id number]  HPI  Wt Readings from Last 3 Encounters:  01/29/24 237 lb (107.5 kg)  05/24/22 228 lb (103.4 kg)  10/08/21 244 lb 4 oz (110.8 kg)   36.30 kg/m  Vitals:   01/29/24 1018  BP: 124/76  Pulse: 71  Temp: 98.1 F (36.7 C)  SpO2: 97%   Pt presents for follow up of chronic medical problems including migraines and sinus symptoms    History of chronic sinusitis Had sinus surgery at age 8 Has seen Dr Jenne Campus in past   Needs refill of cetirizine (spring and fall are his worst symptoms)  Not a lot of drainage    A few weeks ago had headache - migraine vs sinus  Pressure in face and eyes  Did make him nauseated and vomited - so possibly transformed into migraine  Not blowing anything out  No fevers   Uses nasal rinses Has not had the steroid nasal spray recently /has used in season in the past   Excedrin usually knocks it out  Imitrex  Used to be expensive but has not tried to fill yet    Dr Sharyn Lull for allergy in past   Dr Nicholos Johns in pulmonary  OSA Did not tolerate cpap at all  Weight loss helped   Now has gained some back   Has not smoked or vaped since October  Still craves occational  Doing well  Occational etoh -not much     Changed jobs  Separated from wife  50% custody of his son  That is going fairly well      Patient Active Problem List   Diagnosis Date Noted   Tonsillar exudate 05/24/2022   Other fatigue 05/24/2022   Food intolerance 04/27/2021   Family history of colonic polyps 11/15/2020   Folliculitis 03/03/2020   Jock itch 08/19/2017   Chronic nasal congestion 01/15/2017   OSA (obstructive sleep apnea) 07/28/2015   Excessive daytime sleepiness 06/19/2015   Cognitive deficits 06/19/2015   Somnolence, daytime 06/07/2015   Snoring 06/07/2015   Allergic rhinitis 06/07/2015   Stress reaction 06/07/2015   Screening for HIV (human  immunodeficiency virus) 04/21/2015   Obesity 04/21/2015   Elevated transaminase level 12/18/2014   Sinusitis, chronic 11/15/2014   Migraine 11/15/2014   Routine general medical examination at a health care facility 11/15/2014   Past Medical History:  Diagnosis Date   History of headache    Past Surgical History:  Procedure Laterality Date   KNEE SURGERY  2010   SINUS IRRIGATION     age 14   Social History   Tobacco Use   Smoking status: Former    Current packs/day: 0.00    Types: Cigarettes    Quit date: 11/04/2012    Years since quitting: 11.2   Smokeless tobacco: Never  Vaping Use   Vaping status: Former  Substance Use Topics   Alcohol use: Yes    Alcohol/week: 0.0 standard drinks of alcohol    Comment: occ/rare   Drug use: No   Family History  Problem Relation Age of Onset   Hypertension Maternal Grandfather    Diabetes Paternal Grandfather    Hypertension Maternal Uncle    Diabetes Father    No Known Allergies Current Outpatient Medications on File Prior to Visit  Medication Sig Dispense Refill   aspirin-acetaminophen-caffeine (EXCEDRIN MIGRAINE) 250-250-65 MG tablet Take 2 tablets by mouth every  6 (six) hours as needed for headache.     No current facility-administered medications on file prior to visit.    Review of Systems  Constitutional:  Positive for appetite change and fatigue. Negative for fever.  HENT:  Positive for congestion, postnasal drip, rhinorrhea, sinus pressure, sneezing and sore throat. Negative for ear pain and trouble swallowing.   Eyes:  Negative for pain and discharge.  Respiratory:  Positive for cough. Negative for shortness of breath, wheezing and stridor.   Cardiovascular:  Negative for chest pain.  Gastrointestinal:  Negative for diarrhea, nausea and vomiting.  Genitourinary:  Negative for frequency, hematuria and urgency.  Musculoskeletal:  Negative for arthralgias and myalgias.  Skin:  Negative for rash.  Neurological:  Positive  for headaches. Negative for dizziness, weakness and light-headedness.  Psychiatric/Behavioral:  Negative for confusion and dysphoric mood.        Objective:   Physical Exam Constitutional:      General: He is not in acute distress.    Appearance: Normal appearance. He is well-developed. He is obese. He is not ill-appearing, toxic-appearing or diaphoretic.  HENT:     Head: Normocephalic and atraumatic.     Comments: Nares are injected and congested      Right Ear: Tympanic membrane, ear canal and external ear normal.     Left Ear: Tympanic membrane, ear canal and external ear normal.     Nose: Congestion and rhinorrhea present.     Comments: Right nostril is more injected and congested than the right    Mouth/Throat:     Mouth: Mucous membranes are moist.     Pharynx: Oropharynx is clear. No oropharyngeal exudate or posterior oropharyngeal erythema.     Comments: Clear pnd  Eyes:     General:        Right eye: No discharge.        Left eye: No discharge.     Conjunctiva/sclera: Conjunctivae normal.     Pupils: Pupils are equal, round, and reactive to light.  Cardiovascular:     Rate and Rhythm: Normal rate.     Heart sounds: Normal heart sounds.  Pulmonary:     Effort: Pulmonary effort is normal. No respiratory distress.     Breath sounds: Normal breath sounds. No stridor. No wheezing, rhonchi or rales.  Chest:     Chest wall: No tenderness.  Musculoskeletal:     Cervical back: Normal range of motion and neck supple.  Lymphadenopathy:     Cervical: No cervical adenopathy.  Skin:    General: Skin is warm and dry.     Capillary Refill: Capillary refill takes less than 2 seconds.     Findings: No rash.  Neurological:     Mental Status: He is alert.     Cranial Nerves: No cranial nerve deficit.     Sensory: No sensory deficit.     Motor: No weakness.     Coordination: Coordination normal.     Gait: Gait normal.     Deep Tendon Reflexes: Reflexes normal.  Psychiatric:         Mood and Affect: Mood normal.           Assessment & Plan:   Problem List Items Addressed This Visit       Cardiovascular and Mediastinum   Migraine   Triggered by allergies/chronic congestion and also sleep apnea  Intol of cpap/working on weight loss for osa which helped in the past   Sent in cetrizine and flonase  with allergies (use flonase with caution in light of past sinus surgery)  Ref to ENT done   Prescription imitrex sent /I suspect it will be more affordable this time but he will let me know   Staying hydrated Working on good sleep habits       Relevant Medications   SUMAtriptan (IMITREX) 50 MG tablet     Respiratory   Sinusitis, chronic   Relevant Medications   cetirizine (ZYRTEC ALLERGY) 10 MG tablet   fluticasone (FLONASE) 50 MCG/ACT nasal spray   Other Relevant Orders   Ambulatory referral to ENT   OSA (obstructive sleep apnea)   Intol of cpap  Will get back to working on weight loss Not overly tired   Knows this is a migraine trigger         Relevant Orders   Ambulatory referral to ENT   Allergic rhinitis - Primary   In setting of chronic congestion/ sinus surgery as a child  Ref to ENT to check in  For the season -sent zyrtec 10 mg and flonase (may need to get over the counter)   Avoid pollen/allergens when able       Relevant Orders   Ambulatory referral to ENT     Other   Obesity   Discussed how this problem influences overall health and the risks it imposes  Reviewed plan for weight loss with lower calorie diet (via better food choices (lower glycemic and portion control) along with exercise building up to or more than 30 minutes 5 days per week including some aerobic activity and strength training   Important for sleep apnea since pt does not tolerate cpap      Chronic nasal congestion   Prescription flonase to use with caution  Ref for follow up with ENT      Relevant Orders   Ambulatory referral to ENT

## 2024-01-29 NOTE — Patient Instructions (Addendum)
 Since sleep apnea triggers your migraines - weight loss would be most helpfu  Work on diet and exercise    I will send in generic zyrtec and flonase for allergies  Avoid the pollen when you can    I will send in sumatriptamn/ imitrex - I suspect it will be covered for migranes   I put the referral in for ENT Please let us know if you don't hear in 1-2 weeks

## 2024-01-29 NOTE — Assessment & Plan Note (Signed)
 Discussed how this problem influences overall health and the risks it imposes  Reviewed plan for weight loss with lower calorie diet (via better food choices (lower glycemic and portion control) along with exercise building up to or more than 30 minutes 5 days per week including some aerobic activity and strength training   Important for sleep apnea since pt does not tolerate cpap

## 2024-02-02 DIAGNOSIS — M9903 Segmental and somatic dysfunction of lumbar region: Secondary | ICD-10-CM | POA: Diagnosis not present

## 2024-02-02 DIAGNOSIS — M9902 Segmental and somatic dysfunction of thoracic region: Secondary | ICD-10-CM | POA: Diagnosis not present

## 2024-02-02 DIAGNOSIS — M542 Cervicalgia: Secondary | ICD-10-CM | POA: Diagnosis not present

## 2024-02-02 DIAGNOSIS — M9901 Segmental and somatic dysfunction of cervical region: Secondary | ICD-10-CM | POA: Diagnosis not present

## 2024-02-06 DIAGNOSIS — M9901 Segmental and somatic dysfunction of cervical region: Secondary | ICD-10-CM | POA: Diagnosis not present

## 2024-02-06 DIAGNOSIS — M9903 Segmental and somatic dysfunction of lumbar region: Secondary | ICD-10-CM | POA: Diagnosis not present

## 2024-02-06 DIAGNOSIS — M9902 Segmental and somatic dysfunction of thoracic region: Secondary | ICD-10-CM | POA: Diagnosis not present

## 2024-02-06 DIAGNOSIS — M542 Cervicalgia: Secondary | ICD-10-CM | POA: Diagnosis not present

## 2024-02-09 DIAGNOSIS — M9901 Segmental and somatic dysfunction of cervical region: Secondary | ICD-10-CM | POA: Diagnosis not present

## 2024-02-09 DIAGNOSIS — M9903 Segmental and somatic dysfunction of lumbar region: Secondary | ICD-10-CM | POA: Diagnosis not present

## 2024-02-09 DIAGNOSIS — M9902 Segmental and somatic dysfunction of thoracic region: Secondary | ICD-10-CM | POA: Diagnosis not present

## 2024-02-09 DIAGNOSIS — M542 Cervicalgia: Secondary | ICD-10-CM | POA: Diagnosis not present

## 2024-02-11 DIAGNOSIS — M542 Cervicalgia: Secondary | ICD-10-CM | POA: Diagnosis not present

## 2024-02-11 DIAGNOSIS — M9902 Segmental and somatic dysfunction of thoracic region: Secondary | ICD-10-CM | POA: Diagnosis not present

## 2024-02-11 DIAGNOSIS — M9903 Segmental and somatic dysfunction of lumbar region: Secondary | ICD-10-CM | POA: Diagnosis not present

## 2024-02-11 DIAGNOSIS — M9901 Segmental and somatic dysfunction of cervical region: Secondary | ICD-10-CM | POA: Diagnosis not present

## 2024-02-16 DIAGNOSIS — M542 Cervicalgia: Secondary | ICD-10-CM | POA: Diagnosis not present

## 2024-02-16 DIAGNOSIS — M9903 Segmental and somatic dysfunction of lumbar region: Secondary | ICD-10-CM | POA: Diagnosis not present

## 2024-02-16 DIAGNOSIS — M9902 Segmental and somatic dysfunction of thoracic region: Secondary | ICD-10-CM | POA: Diagnosis not present

## 2024-02-16 DIAGNOSIS — M9901 Segmental and somatic dysfunction of cervical region: Secondary | ICD-10-CM | POA: Diagnosis not present

## 2024-02-18 DIAGNOSIS — M9903 Segmental and somatic dysfunction of lumbar region: Secondary | ICD-10-CM | POA: Diagnosis not present

## 2024-02-18 DIAGNOSIS — M9901 Segmental and somatic dysfunction of cervical region: Secondary | ICD-10-CM | POA: Diagnosis not present

## 2024-02-18 DIAGNOSIS — M542 Cervicalgia: Secondary | ICD-10-CM | POA: Diagnosis not present

## 2024-02-18 DIAGNOSIS — M9902 Segmental and somatic dysfunction of thoracic region: Secondary | ICD-10-CM | POA: Diagnosis not present

## 2024-02-23 DIAGNOSIS — M9902 Segmental and somatic dysfunction of thoracic region: Secondary | ICD-10-CM | POA: Diagnosis not present

## 2024-02-23 DIAGNOSIS — M9901 Segmental and somatic dysfunction of cervical region: Secondary | ICD-10-CM | POA: Diagnosis not present

## 2024-02-23 DIAGNOSIS — M9903 Segmental and somatic dysfunction of lumbar region: Secondary | ICD-10-CM | POA: Diagnosis not present

## 2024-02-23 DIAGNOSIS — M542 Cervicalgia: Secondary | ICD-10-CM | POA: Diagnosis not present

## 2024-02-25 DIAGNOSIS — M9903 Segmental and somatic dysfunction of lumbar region: Secondary | ICD-10-CM | POA: Diagnosis not present

## 2024-02-25 DIAGNOSIS — M9902 Segmental and somatic dysfunction of thoracic region: Secondary | ICD-10-CM | POA: Diagnosis not present

## 2024-02-25 DIAGNOSIS — M9901 Segmental and somatic dysfunction of cervical region: Secondary | ICD-10-CM | POA: Diagnosis not present

## 2024-02-25 DIAGNOSIS — M542 Cervicalgia: Secondary | ICD-10-CM | POA: Diagnosis not present

## 2024-03-11 DIAGNOSIS — R0981 Nasal congestion: Secondary | ICD-10-CM | POA: Diagnosis not present

## 2024-03-11 DIAGNOSIS — J301 Allergic rhinitis due to pollen: Secondary | ICD-10-CM | POA: Diagnosis not present

## 2024-03-11 DIAGNOSIS — J309 Allergic rhinitis, unspecified: Secondary | ICD-10-CM | POA: Diagnosis not present

## 2024-03-11 DIAGNOSIS — J329 Chronic sinusitis, unspecified: Secondary | ICD-10-CM | POA: Diagnosis not present

## 2024-04-01 DIAGNOSIS — J309 Allergic rhinitis, unspecified: Secondary | ICD-10-CM | POA: Diagnosis not present

## 2024-04-01 DIAGNOSIS — J329 Chronic sinusitis, unspecified: Secondary | ICD-10-CM | POA: Diagnosis not present

## 2024-06-23 ENCOUNTER — Ambulatory Visit: Admitting: Family Medicine

## 2024-06-23 ENCOUNTER — Encounter: Payer: Self-pay | Admitting: Family Medicine

## 2024-06-23 VITALS — BP 132/86 | HR 89 | Temp 99.7°F | Ht 67.5 in | Wt 236.1 lb

## 2024-06-23 DIAGNOSIS — R059 Cough, unspecified: Secondary | ICD-10-CM

## 2024-06-23 DIAGNOSIS — R509 Fever, unspecified: Secondary | ICD-10-CM | POA: Diagnosis not present

## 2024-06-23 DIAGNOSIS — J111 Influenza due to unidentified influenza virus with other respiratory manifestations: Secondary | ICD-10-CM | POA: Diagnosis not present

## 2024-06-23 LAB — POC COVID19 BINAXNOW: SARS Coronavirus 2 Ag: NEGATIVE

## 2024-06-23 LAB — POCT INFLUENZA A/B
Influenza A, POC: POSITIVE — AB
Influenza B, POC: NEGATIVE

## 2024-06-23 MED ORDER — BENZONATATE 200 MG PO CAPS: 200.0000 mg | ORAL_CAPSULE | Freq: Three times a day (TID) | ORAL | 1 refills | Status: DC | PRN

## 2024-06-23 NOTE — Assessment & Plan Note (Signed)
 Day 5 of uri symptoms with fever after travel to [facility]  Positive flu A test, neg covid test  Out of the time window for tamiflu  Reassuring exam Disc symptomatic care - see instructions on AVS  Tessalon  sent to pharmacy   Update if not starting to improve in a week or if worsening  Call back and Er precautions noted in detail today   Handout given

## 2024-06-23 NOTE — Patient Instructions (Addendum)
 Your flu test is positive   Drink fluids and rest  mucinex  DM is good for cough and congestion (or day quil/ny quil)  Nasal saline for congestion as needed  Tylenol for fever or pain or headache  Please alert us  if symptoms worsen (if severe or short of breath please go to the ER)   Ibuprofen  can help additionally for fever and headache   I sent in generic tessalon  for cough   Update if not starting to improve in a week or if worsening

## 2024-06-23 NOTE — Progress Notes (Signed)
 Subjective:    Patient ID: Max Duffy, male    DOB: 1996/02/27, 28 y.o.   MRN: [id number]  HPI  Wt Readings from Last 3 Encounters:  06/23/24 236 lb 2 oz (107.1 kg)  01/29/24 237 lb (107.5 kg)  05/24/22 228 lb (103.4 kg)   36.44 kg/m  Vitals:   06/23/24 1125  BP: 132/86  Pulse: 89  Temp: 99.7 F (37.6 C)  SpO2: 98%    Pt presents with uri symptoms  Cough  Congestion  Fatigue Sinus symptoms  Fever off /on    Went to [place] last week    History of chronic sinusitis in past with sinus surgery as a child   Symptoms started Friday night  On flight home  Felt tired and head congestion and pressure  Some sneezing  Slept all day sat and Sunday   Cough is productive - phlegm is green  No wheezing  Chest is sore  Some ST Worse at night  Some head ache (different from migraine)   Ears hurt a bit on and off / full /has to pop them    Hot and cold sweats  Highest temp 100.3   Started feeling a little better Monday  Then worse mid day Tuesday    Flu and covid tests  Flu test positive   Results for orders placed or performed in visit on 06/23/24  POC COVID-19   Collection Time: 06/23/24 11:35 AM  Result Value Ref Range   SARS Coronavirus 2 Ag Negative Negative  POCT Influenza A/B   Collection Time: 06/23/24 11:35 AM  Result Value Ref Range   Influenza A, POC Positive (A) Negative   Influenza B, POC Negative Negative     Drinking water   Over the counter Day quil Nyquil          Patient Active Problem List   Diagnosis Date Noted   Influenza 06/23/2024   Tonsillar exudate 05/24/2022   Other fatigue 05/24/2022   Food intolerance 04/27/2021   Family history of colonic polyps 11/15/2020   Folliculitis 03/03/2020   Jock itch 08/19/2017   Chronic nasal congestion 01/15/2017   OSA (obstructive sleep apnea) 07/28/2015   Excessive daytime sleepiness 06/19/2015   Cognitive deficits 06/19/2015   Somnolence, daytime 06/07/2015    Snoring 06/07/2015   Allergic rhinitis 06/07/2015   Stress reaction 06/07/2015   Screening for HIV (human immunodeficiency virus) 04/21/2015   Obesity 04/21/2015   Elevated transaminase level 12/18/2014   Sinusitis, chronic 11/15/2014   Migraine 11/15/2014   Routine general medical examination at a health care facility 11/15/2014   Past Medical History:  Diagnosis Date   History of headache    Past Surgical History:  Procedure Laterality Date   KNEE SURGERY  2010   SINUS IRRIGATION     age 61   Social History   Tobacco Use   Smoking status: Former    Current packs/day: 0.00    Types: Cigarettes    Quit date: 11/04/2012    Years since quitting: 11.6   Smokeless tobacco: Never  Vaping Use   Vaping status: Former  Substance Use Topics   Alcohol use: Yes    Alcohol/week: 0.0 standard drinks of alcohol    Comment: occ/rare   Drug use: No   Family History  Problem Relation Age of Onset   Hypertension Maternal Grandfather    Diabetes Paternal Grandfather    Hypertension Maternal Uncle    Diabetes Father    No Known Allergies  Current Outpatient Medications on File Prior to Visit  Medication Sig Dispense Refill   aspirin-acetaminophen-caffeine (EXCEDRIN MIGRAINE) 250-250-65 MG tablet Take 2 tablets by mouth every 6 (six) hours as needed for headache.     cetirizine  (ZYRTEC  ALLERGY ) 10 MG tablet Take 1 tablet (10 mg total) by mouth daily. In evening 90 tablet 3   fluticasone  (FLONASE ) 50 MCG/ACT nasal spray Place 2 sprays into both nostrils daily. In allergy  season 16 g 6   SUMAtriptan  (IMITREX ) 50 MG tablet TAKE 1 TABLET BY MOUTH ONCE AS NEEDED FOR MIGRAINE. MAY REPEAT IN 2 HOURS IF HEADACHE PERSISTS 9 tablet 5   No current facility-administered medications on file prior to visit.    Review of Systems  Constitutional:  Positive for appetite change, fatigue and fever.  HENT:  Positive for congestion, postnasal drip, rhinorrhea, sinus pressure, sneezing and sore throat.  Negative for ear pain.   Eyes:  Negative for pain and discharge.  Respiratory:  Positive for cough. Negative for shortness of breath, wheezing and stridor.   Cardiovascular:  Negative for chest pain.  Gastrointestinal:  Negative for diarrhea, nausea and vomiting.  Genitourinary:  Negative for frequency, hematuria and urgency.  Musculoskeletal:  Negative for arthralgias and myalgias.  Skin:  Negative for rash.  Neurological:  Positive for headaches. Negative for dizziness, weakness and light-headedness.  Psychiatric/Behavioral:  Negative for confusion and dysphoric mood.        Objective:   Physical Exam Constitutional:      General: He is not in acute distress.    Appearance: Normal appearance. He is well-developed. He is obese. He is not ill-appearing, toxic-appearing or diaphoretic.  HENT:     Head: Normocephalic and atraumatic.     Comments: Nares are injected and congested      Right Ear: Tympanic membrane, ear canal and external ear normal.     Left Ear: Tympanic membrane, ear canal and external ear normal.     Nose: Congestion and rhinorrhea present.     Mouth/Throat:     Mouth: Mucous membranes are moist.     Pharynx: Oropharynx is clear. No oropharyngeal exudate or posterior oropharyngeal erythema.     Comments: Clear pnd  Eyes:     General:        Right eye: No discharge.        Left eye: No discharge.     Conjunctiva/sclera: Conjunctivae normal.     Pupils: Pupils are equal, round, and reactive to light.  Cardiovascular:     Rate and Rhythm: Normal rate.     Heart sounds: Normal heart sounds.  Pulmonary:     Effort: Pulmonary effort is normal. No respiratory distress.     Breath sounds: Normal breath sounds. No stridor. No wheezing, rhonchi or rales.     Comments: Good air exch No rales or rhonchi No wheeze even on forced exp  Chest:     Chest wall: No tenderness.  Musculoskeletal:     Cervical back: Normal range of motion and neck supple.  Lymphadenopathy:      Cervical: No cervical adenopathy.  Skin:    General: Skin is warm and dry.     Capillary Refill: Capillary refill takes less than 2 seconds.     Findings: No rash.  Neurological:     Mental Status: He is alert.     Cranial Nerves: No cranial nerve deficit.  Psychiatric:        Mood and Affect: Mood normal.  Assessment & Plan:   Problem List Items Addressed This Visit       Respiratory   Influenza   Day 5 of uri symptoms with fever after travel to [facility]  Positive flu A test, neg covid test  Out of the time window for tamiflu  Reassuring exam Disc symptomatic care - see instructions on AVS  Tessalon  sent to pharmacy   Update if not starting to improve in a week or if worsening  Call back and Er precautions noted in detail today   Handout given        Other Visit Diagnoses       Fever, unspecified fever cause    -  Primary   Relevant Orders   POC COVID-19 (Completed)   POCT Influenza A/B (Completed)     Cough, unspecified type       Relevant Orders   POC COVID-19 (Completed)   POCT Influenza A/B (Completed)

## 2024-07-09 DIAGNOSIS — M9902 Segmental and somatic dysfunction of thoracic region: Secondary | ICD-10-CM | POA: Diagnosis not present

## 2024-07-09 DIAGNOSIS — M542 Cervicalgia: Secondary | ICD-10-CM | POA: Diagnosis not present

## 2024-07-09 DIAGNOSIS — M9903 Segmental and somatic dysfunction of lumbar region: Secondary | ICD-10-CM | POA: Diagnosis not present

## 2024-07-09 DIAGNOSIS — M9901 Segmental and somatic dysfunction of cervical region: Secondary | ICD-10-CM | POA: Diagnosis not present

## 2024-07-14 DIAGNOSIS — M9902 Segmental and somatic dysfunction of thoracic region: Secondary | ICD-10-CM | POA: Diagnosis not present

## 2024-07-14 DIAGNOSIS — M9901 Segmental and somatic dysfunction of cervical region: Secondary | ICD-10-CM | POA: Diagnosis not present

## 2024-07-14 DIAGNOSIS — M542 Cervicalgia: Secondary | ICD-10-CM | POA: Diagnosis not present

## 2024-07-14 DIAGNOSIS — M9903 Segmental and somatic dysfunction of lumbar region: Secondary | ICD-10-CM | POA: Diagnosis not present

## 2024-07-21 DIAGNOSIS — M542 Cervicalgia: Secondary | ICD-10-CM | POA: Diagnosis not present

## 2024-07-21 DIAGNOSIS — M9901 Segmental and somatic dysfunction of cervical region: Secondary | ICD-10-CM | POA: Diagnosis not present

## 2024-07-21 DIAGNOSIS — M9903 Segmental and somatic dysfunction of lumbar region: Secondary | ICD-10-CM | POA: Diagnosis not present

## 2024-07-21 DIAGNOSIS — M9902 Segmental and somatic dysfunction of thoracic region: Secondary | ICD-10-CM | POA: Diagnosis not present

## 2024-07-28 DIAGNOSIS — M542 Cervicalgia: Secondary | ICD-10-CM | POA: Diagnosis not present

## 2024-07-28 DIAGNOSIS — M9903 Segmental and somatic dysfunction of lumbar region: Secondary | ICD-10-CM | POA: Diagnosis not present

## 2024-07-28 DIAGNOSIS — M9902 Segmental and somatic dysfunction of thoracic region: Secondary | ICD-10-CM | POA: Diagnosis not present

## 2024-07-28 DIAGNOSIS — M9901 Segmental and somatic dysfunction of cervical region: Secondary | ICD-10-CM | POA: Diagnosis not present

## 2024-10-11 DIAGNOSIS — M9903 Segmental and somatic dysfunction of lumbar region: Secondary | ICD-10-CM | POA: Diagnosis not present

## 2024-10-11 DIAGNOSIS — M9901 Segmental and somatic dysfunction of cervical region: Secondary | ICD-10-CM | POA: Diagnosis not present

## 2024-10-11 DIAGNOSIS — M9902 Segmental and somatic dysfunction of thoracic region: Secondary | ICD-10-CM | POA: Diagnosis not present

## 2024-10-11 DIAGNOSIS — M542 Cervicalgia: Secondary | ICD-10-CM | POA: Diagnosis not present

## 2024-11-10 ENCOUNTER — Ambulatory Visit (INDEPENDENT_AMBULATORY_CARE_PROVIDER_SITE_OTHER): Admitting: Family Medicine

## 2024-11-10 ENCOUNTER — Encounter: Payer: Self-pay | Admitting: Family Medicine

## 2024-11-10 VITALS — BP 118/78 | HR 66 | Temp 98.2°F | Ht 67.5 in | Wt 237.4 lb

## 2024-11-10 DIAGNOSIS — Z6836 Body mass index (BMI) 36.0-36.9, adult: Secondary | ICD-10-CM | POA: Diagnosis not present

## 2024-11-10 DIAGNOSIS — Z860100 Personal history of colon polyps, unspecified: Secondary | ICD-10-CM

## 2024-11-10 DIAGNOSIS — E66812 Obesity, class 2: Secondary | ICD-10-CM

## 2024-11-10 DIAGNOSIS — E6609 Other obesity due to excess calories: Secondary | ICD-10-CM | POA: Diagnosis not present

## 2024-11-10 DIAGNOSIS — Z0000 Encounter for general adult medical examination without abnormal findings: Secondary | ICD-10-CM | POA: Diagnosis not present

## 2024-11-10 NOTE — Assessment & Plan Note (Signed)
 Reviewed health habits including diet and exercise and skin cancer prevention Reviewed appropriate screening tests for age  Also reviewed health mt list, fam hx and immunization status , as well as social and family history   See HPI Labs reviewed and ordered Health Maintenance  Topic Date Due   Hepatitis B Vaccine (1 of 3 - 19+ 3-dose series) Never done   HPV Vaccine (1 - 3-dose SCDM series) Never done   Flu Shot  02/01/2025*   COVID-19 Vaccine (3 - 2025-26 season) 02/13/2026*   DTaP/Tdap/Td vaccine (3 - Td or Tdap) 03/03/2030   Hepatitis C Screening  Completed   HIV Screening  Completed   Pneumococcal Vaccine  Aged Out   Meningitis B Vaccine  Aged Out  *Topic was postponed. The date shown is not the original due date.    Colonoscopy will be due 2027/sent for report from 2022 PHQ 0 Declines flu shot  Wellness labs today

## 2024-11-10 NOTE — Progress Notes (Signed)
 "  Subjective:    Patient ID: Max Duffy, male    DOB: 05-Nov-1995, 29 y.o.   MRN: [id number]  HPI  Here for health maintenance exam and to review chronic medical problems   Wt Readings from Last 3 Encounters:  11/10/24 237 lb 6 oz (107.7 kg)  06/23/24 236 lb 2 oz (107.1 kg)  01/29/24 237 lb (107.5 kg)   36.63 kg/m  Vitals:   11/10/24 1533  BP: 118/78  Pulse: 66  Temp: 98.2 F (36.8 C)  SpO2: 98%    Immunization History  Administered Date(s) Administered   PFIZER(Purple Top)SARS-COV-2 Vaccination 08/08/2020, 08/29/2020   Tdap 08/31/2009, 03/03/2020    Health Maintenance Due  Topic Date Due   Hepatitis B Vaccines 19-59 Average Risk (1 of 3 - 19+ 3-dose series) Never done   HPV VACCINES (1 - 3-dose SCDM series) Never done   Flu shot -declines    STD screening  Not needed/not interested   Prostate health No prostate cancer in family  No voiding problems    Colon cancer screening -due colonoscopy 2027 ?     History of polyps Colon polyps in family /no cancer    Bone health   Falls-slipped loading a truck yesterday and hit is back on metal rail -sore today (took ibuprofen  and tylenol)  Fractures-none  Supplements    Exercise  Started back with disk golf  Plans to get into the gym    Mood    11/10/2024    3:37 PM 06/23/2024   11:38 AM 01/29/2024   10:28 AM  Depression screen PHQ 2/9  Decreased Interest 0 0 0  Down, Depressed, Hopeless 0 0 0  PHQ - 2 Score 0 0 0  Altered sleeping 0 1 0  Tired, decreased energy 0 1 0  Change in appetite 0 1 0  Feeling bad or failure about yourself  0 0 0  Trouble concentrating 0 0 0  Moving slowly or fidgety/restless 0 0 0  Suicidal thoughts 0 0 0  PHQ-9 Score 0 3  0   Difficult doing work/chores Not difficult at all Not difficult at all Not difficult at all     Data saved with a previous flowsheet row definition  Mood is good   Good relationship   Trying to eat healthier  Juice cleanse this week       Patient Active Problem List   Diagnosis Date Noted   History of colon polyps 11/10/2024   Food intolerance 04/27/2021   Family history of colonic polyps 11/15/2020   Jock itch 08/19/2017   Chronic nasal congestion 01/15/2017   OSA (obstructive sleep apnea) 07/28/2015   Excessive daytime sleepiness 06/19/2015   Cognitive deficits 06/19/2015   Somnolence, daytime 06/07/2015   Snoring 06/07/2015   Allergic rhinitis 06/07/2015   Screening for HIV (human immunodeficiency virus) 04/21/2015   Obesity 04/21/2015   Elevated transaminase level 12/18/2014   Sinusitis, chronic 11/15/2014   Migraine 11/15/2014   Routine general medical examination at a health care facility 11/15/2014   Past Medical History:  Diagnosis Date   History of headache    Past Surgical History:  Procedure Laterality Date   KNEE SURGERY  2010   SINUS IRRIGATION     age 59   Social History[1] Family History  Problem Relation Age of Onset   Hypertension Maternal Grandfather    Diabetes Paternal Grandfather    Hypertension Maternal Uncle    Diabetes Father    Allergies[2] Medications Ordered Prior  to Encounter[3]  Review of Systems  Constitutional:  Negative for activity change, appetite change, fatigue, fever and unexpected weight change.  HENT:  Negative for congestion, rhinorrhea, sore throat and trouble swallowing.   Eyes:  Negative for pain, redness, itching and visual disturbance.  Respiratory:  Negative for cough, chest tightness, shortness of breath and wheezing.   Cardiovascular:  Negative for chest pain and palpitations.  Gastrointestinal:  Negative for abdominal pain, blood in stool, constipation, diarrhea and nausea.  Endocrine: Negative for cold intolerance, heat intolerance, polydipsia and polyuria.  Genitourinary:  Negative for difficulty urinating, dysuria, frequency and urgency.  Musculoskeletal:  Negative for arthralgias, joint swelling and myalgias.       Contused area on back   Improved from when he injured it   Skin:  Negative for pallor and rash.  Neurological:  Negative for dizziness, tremors, weakness, numbness and headaches.  Hematological:  Negative for adenopathy. Does not bruise/bleed easily.  Psychiatric/Behavioral:  Negative for decreased concentration and dysphoric mood. The patient is not nervous/anxious.        Objective:   Physical Exam Constitutional:      General: He is not in acute distress.    Appearance: Normal appearance. He is well-developed. He is obese. He is not ill-appearing or diaphoretic.  HENT:     Head: Normocephalic and atraumatic.     Right Ear: Tympanic membrane, ear canal and external ear normal.     Left Ear: Tympanic membrane, ear canal and external ear normal.     Nose: Nose normal. No congestion.     Mouth/Throat:     Mouth: Mucous membranes are moist.     Pharynx: Oropharynx is clear. No posterior oropharyngeal erythema.  Eyes:     General: No scleral icterus.       Right eye: No discharge.        Left eye: No discharge.     Conjunctiva/sclera: Conjunctivae normal.     Pupils: Pupils are equal, round, and reactive to light.  Neck:     Thyroid: No thyromegaly.     Vascular: No carotid bruit or JVD.  Cardiovascular:     Rate and Rhythm: Normal rate and regular rhythm.     Pulses: Normal pulses.     Heart sounds: Normal heart sounds.     No gallop.  Pulmonary:     Effort: Pulmonary effort is normal. No respiratory distress.     Breath sounds: Normal breath sounds. No wheezing or rales.     Comments: Good air exch Chest:     Chest wall: No tenderness.  Abdominal:     General: Bowel sounds are normal. There is no distension or abdominal bruit.     Palpations: Abdomen is soft. There is no mass.     Tenderness: There is no abdominal tenderness.     Hernia: No hernia is present.  Musculoskeletal:        General: No tenderness.     Cervical back: Normal range of motion and neck supple. No rigidity. No muscular  tenderness.     Right lower leg: No edema.     Left lower leg: No edema.  Lymphadenopathy:     Cervical: No cervical adenopathy.  Skin:    General: Skin is warm and dry.     Coloration: Skin is not pale.     Findings: No erythema or rash.     Comments: Solar lentigines diffusely   Neurological:     Mental Status: He is alert.  Cranial Nerves: No cranial nerve deficit.     Motor: No abnormal muscle tone.     Coordination: Coordination normal.     Gait: Gait normal.     Deep Tendon Reflexes: Reflexes are normal and symmetric. Reflexes normal.  Psychiatric:        Attention and Perception: Attention normal.        Mood and Affect: Mood normal.        Cognition and Memory: Cognition and memory normal.     Comments: Mood is good           Assessment & Plan:   Problem List Items Addressed This Visit       Other   Routine general medical examination at a health care facility - Primary   Reviewed health habits including diet and exercise and skin cancer prevention Reviewed appropriate screening tests for age  Also reviewed health mt list, fam hx and immunization status , as well as social and family history   See HPI Labs reviewed and ordered Health Maintenance  Topic Date Due   Hepatitis B Vaccine (1 of 3 - 19+ 3-dose series) Never done   HPV Vaccine (1 - 3-dose SCDM series) Never done   Flu Shot  02/01/2025*   COVID-19 Vaccine (3 - 2025-26 season) 02/13/2026*   DTaP/Tdap/Td vaccine (3 - Td or Tdap) 03/03/2030   Hepatitis C Screening  Completed   HIV Screening  Completed   Pneumococcal Vaccine  Aged Out   Meningitis B Vaccine  Aged Out  *Topic was postponed. The date shown is not the original due date.    Colonoscopy will be due 2027/sent for report from 2022 PHQ 0 Declines flu shot  Wellness labs today      Relevant Orders   TSH   Lipid Panel   CBC with Differential/Platelet   Comprehensive metabolic panel with GFR   Obesity   Discussed how this  problem influences overall health and the risks it imposes  Reviewed plan for weight loss with lower calorie diet (via better food choices (lower glycemic and portion control) along with exercise building up to or more than 30 minutes 5 days per week including some aerobic activity and strength training    Encouraged to work on strength training and lean protein intake       History of colon polyps   Sent for colonoscopy report from 01/2021  Recall will be 01/2026  Has family history of polyps as well         [1]  Social History Tobacco Use   Smoking status: Former    Current packs/day: 0.00    Types: Cigarettes    Quit date: 11/04/2012    Years since quitting: 12.0   Smokeless tobacco: Never  Vaping Use   Vaping status: Former  Substance Use Topics   Alcohol use: Yes    Alcohol/week: 0.0 standard drinks of alcohol    Comment: occ/rare   Drug use: No  [2] No Known Allergies [3]  Current Outpatient Medications on File Prior to Visit  Medication Sig Dispense Refill   aspirin-acetaminophen-caffeine (EXCEDRIN MIGRAINE) 250-250-65 MG tablet Take 2 tablets by mouth every 6 (six) hours as needed for headache.     cetirizine  (ZYRTEC  ALLERGY ) 10 MG tablet Take 1 tablet (10 mg total) by mouth daily. In evening 90 tablet 3   fluticasone  (FLONASE ) 50 MCG/ACT nasal spray Place 2 sprays into both nostrils daily. In allergy  season 16 g 6   SUMAtriptan  (IMITREX ) 50  MG tablet TAKE 1 TABLET BY MOUTH ONCE AS NEEDED FOR MIGRAINE. MAY REPEAT IN 2 HOURS IF HEADACHE PERSISTS 9 tablet 5   No current facility-administered medications on file prior to visit.   "

## 2024-11-10 NOTE — Assessment & Plan Note (Signed)
 Discussed how this problem influences overall health and the risks it imposes  Reviewed plan for weight loss with lower calorie diet (via better food choices (lower glycemic and portion control) along with exercise building up to or more than 30 minutes 5 days per week including some aerobic activity and strength training    Encouraged to work on strength training and lean protein intake

## 2024-11-10 NOTE — Assessment & Plan Note (Signed)
 Sent for colonoscopy report from 01/2021  Recall will be 01/2026  Has family history of polyps as well

## 2024-11-10 NOTE — Patient Instructions (Addendum)
 Stay active  Focus on strength training at least 3 days per week (goal is muscle gain for your metabolism)    Avoid added sugars in your diet when you can  Try to get most of your carbohydrates from produce (with the exception of white potatoes) and whole grains Eat less bread/pasta/rice/snack foods/cereals/sweets and other items from the middle of the grocery store (processed carbs) Eat lots of lean protein    Lab today for wellness   Take care of yourself

## 2024-11-11 ENCOUNTER — Ambulatory Visit: Payer: Self-pay | Admitting: Family Medicine

## 2024-11-11 ENCOUNTER — Telehealth: Payer: Self-pay | Admitting: *Deleted

## 2024-11-11 DIAGNOSIS — R7401 Elevation of levels of liver transaminase levels: Secondary | ICD-10-CM

## 2024-11-11 LAB — LIPID PANEL
Cholesterol: 154 mg/dL (ref 28–200)
HDL: 33.9 mg/dL — ABNORMAL LOW
LDL Cholesterol: 102 mg/dL — ABNORMAL HIGH (ref 10–99)
NonHDL: 120.31
Total CHOL/HDL Ratio: 5
Triglycerides: 94 mg/dL (ref 10.0–149.0)
VLDL: 18.8 mg/dL (ref 0.0–40.0)

## 2024-11-11 LAB — COMPREHENSIVE METABOLIC PANEL WITH GFR
ALT: 57 U/L — ABNORMAL HIGH (ref 3–53)
AST: 25 U/L (ref 5–37)
Albumin: 5.1 g/dL (ref 3.5–5.2)
Alkaline Phosphatase: 42 U/L (ref 39–117)
BUN: 11 mg/dL (ref 6–23)
CO2: 29 meq/L (ref 19–32)
Calcium: 9.7 mg/dL (ref 8.4–10.5)
Chloride: 101 meq/L (ref 96–112)
Creatinine, Ser: 1.06 mg/dL (ref 0.40–1.50)
GFR: 94.04 mL/min
Glucose, Bld: 75 mg/dL (ref 70–99)
Potassium: 4.3 meq/L (ref 3.5–5.1)
Sodium: 138 meq/L (ref 135–145)
Total Bilirubin: 0.9 mg/dL (ref 0.2–1.2)
Total Protein: 7.6 g/dL (ref 6.0–8.3)

## 2024-11-11 LAB — CBC WITH DIFFERENTIAL/PLATELET
Basophils Absolute: 0.1 K/uL (ref 0.0–0.1)
Basophils Relative: 2 % (ref 0.0–3.0)
Eosinophils Absolute: 0.3 K/uL (ref 0.0–0.7)
Eosinophils Relative: 4.8 % (ref 0.0–5.0)
HCT: 43.1 % (ref 39.0–52.0)
Hemoglobin: 15.1 g/dL (ref 13.0–17.0)
Lymphocytes Relative: 52.9 % — ABNORMAL HIGH (ref 12.0–46.0)
Lymphs Abs: 3.1 K/uL (ref 0.7–4.0)
MCHC: 35.1 g/dL (ref 30.0–36.0)
MCV: 82.5 fl (ref 78.0–100.0)
Monocytes Absolute: 0.7 K/uL (ref 0.1–1.0)
Monocytes Relative: 12.2 % — ABNORMAL HIGH (ref 3.0–12.0)
Neutro Abs: 1.6 K/uL (ref 1.4–7.7)
Neutrophils Relative %: 28.1 % — ABNORMAL LOW (ref 43.0–77.0)
Platelets: 252 K/uL (ref 150.0–400.0)
RBC: 5.23 Mil/uL (ref 4.22–5.81)
RDW: 12.9 % (ref 11.5–15.5)
WBC: 5.8 K/uL (ref 4.0–10.5)

## 2024-11-11 LAB — TSH: TSH: 1.22 u[IU]/mL (ref 0.35–5.50)

## 2024-11-11 NOTE — Telephone Encounter (Signed)
 Pt viewed lab results via mychart. Per Dr. Randeen:  Re check liver panel in about a month   Please schedule a non fasting lab appt in one month, thanks

## 2024-12-13 ENCOUNTER — Other Ambulatory Visit
